# Patient Record
Sex: Female | Born: 1995 | Race: White | Hispanic: No | Marital: Single | Smoking: Never smoker
Health system: Southern US, Community
[De-identification: ages and names within clinical notes are randomized; demographics above are authoritative.]

## PROBLEM LIST (undated history)

## (undated) DIAGNOSIS — J45909 Unspecified asthma, uncomplicated: Secondary | ICD-10-CM

## (undated) DIAGNOSIS — K219 Gastro-esophageal reflux disease without esophagitis: Secondary | ICD-10-CM

## (undated) DIAGNOSIS — I319 Disease of pericardium, unspecified: Secondary | ICD-10-CM

## (undated) HISTORY — PX: TYMPANOSTOMY TUBE PLACEMENT: SHX32

## (undated) HISTORY — PX: TONSILLECTOMY: SUR1361

## (undated) HISTORY — PX: ADENOIDECTOMY: SUR15

## (undated) HISTORY — DX: Gastro-esophageal reflux disease without esophagitis: K21.9

---
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
## 2016-02-16 ENCOUNTER — Encounter (HOSPITAL_COMMUNITY): Payer: Self-pay | Admitting: Emergency Medicine

## 2016-02-16 ENCOUNTER — Emergency Department (HOSPITAL_COMMUNITY): Payer: BLUE CROSS/BLUE SHIELD

## 2016-02-16 ENCOUNTER — Emergency Department (HOSPITAL_COMMUNITY)
Admission: EM | Admit: 2016-02-16 | Discharge: 2016-02-17 | Disposition: A | Discharge status: Home / Self Care | Payer: BLUE CROSS/BLUE SHIELD | Attending: Emergency Medicine | Admitting: Emergency Medicine

## 2016-02-16 DIAGNOSIS — R0789 Other chest pain: Secondary | ICD-10-CM | POA: Diagnosis not present

## 2016-02-16 DIAGNOSIS — Z79899 Other long term (current) drug therapy: Secondary | ICD-10-CM | POA: Diagnosis not present

## 2016-02-16 DIAGNOSIS — Z3202 Encounter for pregnancy test, result negative: Secondary | ICD-10-CM | POA: Insufficient documentation

## 2016-02-16 DIAGNOSIS — J45909 Unspecified asthma, uncomplicated: Secondary | ICD-10-CM | POA: Insufficient documentation

## 2016-02-16 DIAGNOSIS — R079 Chest pain, unspecified: Secondary | ICD-10-CM | POA: Diagnosis present

## 2016-02-16 HISTORY — DX: Disease of pericardium, unspecified: I31.9

## 2016-02-16 HISTORY — DX: Unspecified asthma, uncomplicated: J45.909

## 2016-02-16 LAB — I-STAT BETA HCG BLOOD, ED (MC, WL, AP ONLY): I-stat hCG, quantitative: <5 m[IU]/mL (ref <5)

## 2016-02-16 LAB — I-STAT TROPONIN, ED: TROPONIN I, POC: 0 ng/mL (ref 0.00–0.08)

## 2016-02-16 LAB — BASIC METABOLIC PANEL
Anion gap: 12 (ref 5–15)
BUN: 8 mg/dL (ref 6–20)
CO2: 23 mmol/L (ref 22–32)
CREATININE: 0.73 mg/dL (ref 0.44–1.00)
Calcium: 9.7 mg/dL (ref 8.9–10.3)
Chloride: 105 mmol/L (ref 101–111)
GFR calc Af Amer: >60 mL/min (ref >60)
Glucose, Bld: 92 mg/dL (ref 65–99)
POTASSIUM: 4.1 mmol/L (ref 3.5–5.1)
SODIUM: 140 mmol/L (ref 135–145)

## 2016-02-16 LAB — CBC
HCT: 44 % (ref 36.0–46.0)
Hemoglobin: 14.6 g/dL (ref 12.0–15.0)
MCH: 28.7 pg (ref 26.0–34.0)
MCHC: 33.2 g/dL (ref 30.0–36.0)
MCV: 86.6 fL (ref 78.0–100.0)
Platelets: 354 10*3/uL (ref 150–400)
RBC: 5.08 MIL/uL (ref 3.87–5.11)
RDW: 12.8 % (ref 11.5–15.5)
WBC: 10.9 10*3/uL — ABNORMAL HIGH (ref 4.0–10.5)

## 2016-02-16 NOTE — ED Notes (Signed)
Pt sts midsternal to left sided CP with SOB x 3 hours; pt sts hx of pericarditis in past

## 2016-02-16 NOTE — ED Provider Notes (Signed)
CSN: [id number]     Arrival date & time 02/16/16  1744 History   First MD Initiated Contact with Patient 02/16/16 2125     Chief Complaint  Patient presents with  . Chest Pain     (Consider location/radiation/quality/duration/timing/severity/associated sxs/prior Treatment) HPI Comments: Patient works for EMS. She developed left-sided chest pain around 2:30 this afternoon while she was waiting to pick up the patient. The pain was on the left side of her chest and radiates to her back. It has been constant since. It is worse with palpation. There is no radiation to her neck or arm. It radiates straight through to her back. There is mild associated shortness of breath. No nausea or vomiting. She did have some diaphoresis. No abdominal pain. No cough or fever. She has not had pain like this in the past. She wonders whether this pain was due to a chemical that was being used to clean the patient's room. The pain is not exertional and not associated with lifting a patient. Denies any leg pain or leg swelling. Reports questionable history of pericarditis. She has seen Dr. Reuel Boom in [facility] regarding palpitations and wore a Holter monitor in the past.  The history is provided by the patient.    Past Medical History  Diagnosis Date  . Asthma   . Pericarditis    History reviewed. No pertinent past surgical history. History reviewed. No pertinent family history. Social History  Substance Use Topics  . Smoking status: Never Smoker   . Smokeless tobacco: None  . Alcohol Use: Yes     Comment: occ   OB History    No data available     Review of Systems  Constitutional: Negative for fever, activity change and appetite change.  HENT: Negative for congestion.   Eyes: Negative for visual disturbance.  Respiratory: Positive for chest tightness and shortness of breath. Negative for cough.   Cardiovascular: Positive for chest pain.  Gastrointestinal: Negative for nausea, vomiting and abdominal  pain.  Genitourinary: Negative for dysuria, hematuria and vaginal discharge.  Musculoskeletal: Negative for myalgias and arthralgias.  Skin: Negative for rash.  Neurological: Negative for dizziness, weakness, light-headedness and headaches.  A complete 10 system review of systems was obtained and all systems are negative except as noted in the HPI and PMH.      Allergies  Review of patient's allergies indicates no known allergies.  Home Medications   Prior to Admission medications   Medication Sig Start Date End Date Taking? Authorizing Provider  esomeprazole (NEXIUM) 40 MG capsule Take 1 capsule by mouth daily.   Yes Historical Provider, MD  mometasone-formoterol (DULERA) 100-5 MCG/ACT AERO Inhale 1 puff into the lungs daily as needed. wheezing   Yes Historical Provider, MD  naproxen (NAPROSYN) 500 MG tablet Take 1 tablet (500 mg total) by mouth 2 (two) times daily. 02/17/16   Glynn Octave, MD   BP 117/78 mmHg  Pulse 55  Temp(Src) 97.5 F (36.4 C) (Oral)  Resp 15  SpO2 97% Physical Exam  Constitutional: She is oriented to person, place, and time. She appears well-developed and well-nourished. No distress.  HENT:  Head: Normocephalic and atraumatic.  Mouth/Throat: Oropharynx is clear and moist. No oropharyngeal exudate.  Eyes: Conjunctivae and EOM are normal. Pupils are equal, round, and reactive to light.  Neck: Normal range of motion. Neck supple.  No meningismus.  Cardiovascular: Normal rate, regular rhythm, normal heart sounds and intact distal pulses.   No murmur heard. Pulmonary/Chest: Effort normal and  breath sounds normal. No respiratory distress. She exhibits tenderness.  Reproducible left-sided chest tenderness. Between breasts. Chaperone present.  Abdominal: Soft. There is no tenderness. There is no rebound and no guarding.  Musculoskeletal: Normal range of motion. She exhibits no edema or tenderness.  Neurological: She is alert and oriented to person, place, and  time. No cranial nerve deficit. She exhibits normal muscle tone. Coordination normal.  No ataxia on finger to nose bilaterally. No pronator drift. 5/5 strength throughout. CN 2-12 intact.Equal grip strength. Sensation intact.   Skin: Skin is warm.  Psychiatric: She has a normal mood and affect. Her behavior is normal.  Nursing note and vitals reviewed.   ED Course  Procedures (including critical care time) Labs Review Labs Reviewed  CBC - Abnormal; Notable for the following:    WBC 10.9 (*)    All other components within normal limits  BASIC METABOLIC PANEL  TROPONIN I  D-DIMER, QUANTITATIVE (NOT AT [facility])  I-STAT TROPOININ, ED  I-STAT BETA HCG BLOOD, ED ([facility], [facility], [facility] ONLY)    Imaging Review Dg Chest 2 View  02/16/2016  CLINICAL DATA:  Chest pain and shortness of Breath EXAM: CHEST  2 VIEW COMPARISON:  None. FINDINGS: The heart size and mediastinal contours are within normal limits. Both lungs are clear. The visualized skeletal structures are unremarkable. IMPRESSION: No active cardiopulmonary disease. Electronically Signed   By: Alcide Clever M.D.   On: 02/16/2016 18:21   I have personally reviewed and evaluated these images and lab results as part of my medical decision-making.   EKG Interpretation   Date/Time:  Thursday February 16 2016 21:44:56 EST Ventricular Rate:  75 PR Interval:  135 QRS Duration: 109 QT Interval:  388 QTC Calculation: 433 R Axis:   77 Text Interpretation:  Sinus rhythm RSR' in V1 or V2, right VCD or RVH  Borderline T abnormalities, anterior leads No previous ECGs available  Confirmed by Cortni Tays  MD, Onis Markoff [phone]) on 02/16/2016 10:08:35 PM      MDM   Final diagnoses:  Atypical chest pain   7.5 hours of ongoing chest pain, unchanged. EKG with nonspecific T-wave inversion lead 3 and V3.  Pain reproducible on exam. Chest x-ray negative.  Chest x-ray negative. No pericardial effusion. Unchanged nonspecific T-wave inversion on EKG.  D-dimer  negative. Troponin negative 2. Low suspicion for ACS.  Treat for musculoskeletal chest pain. No evidence of pericarditis. Follow-up with her PCP tomorrow as scheduled. Return precautions discussed.    EMERGENCY DEPARTMENT [place] CARDIAC EXAM "Study: Limited Ultrasound of the heart and pericardium"  INDICATIONS:Dyspnea Multiple views of the heart and pericardium are obtained with a multi-frequency probe.  PERFORMED NW:GNFAOZ  IMAGES ARCHIVED?: Yes  FINDINGS: No pericardial effusion, Normal contractility and Tamponade physiology absent  LIMITATIONS:  Body habitus and Emergent procedure  VIEWS USED: Subcostal 4 chamber and Apical 4 chamber   INTERPRETATION: Cardiac activity present, Pericardial effusioin absent, Cardiac tamponade absent and Normal contractility  COMMENT:       Glynn Octave, MD 02/17/16 3086

## 2016-02-17 LAB — D-DIMER, QUANTITATIVE: D-Dimer, Quant: <0.27 ug/mL-FEU (ref 0.00–0.50)

## 2016-02-17 LAB — TROPONIN I: Troponin I: <0.03 ng/mL (ref <0.031)

## 2016-02-17 MED ORDER — NAPROXEN 500 MG PO TABS: 500.0000 mg | ORAL_TABLET | Freq: Two times a day (BID) | ORAL | Status: DC

## 2016-02-17 NOTE — Discharge Instructions (Signed)
Chest Wall Pain °There is no evidence of heart attack or blood clot in the lung. Follow up with your doctor. Return to the ED if you develop new or worsening symptoms. °Chest wall pain is pain in or around the bones and muscles of your chest. Sometimes, an injury causes this pain. Sometimes, the cause may not be known. This pain may take several weeks or longer to get better. °HOME CARE INSTRUCTIONS  °Pay attention to any changes in your symptoms. Take these actions to help with your pain:  °· Rest as told by your health care provider.   °· Avoid activities that cause pain. These include any activities that use your chest muscles or your abdominal and side muscles to lift heavy items.    °· If directed, apply ice to the painful area: °¨ Put ice in a plastic bag. °¨ Place a towel between your skin and the bag. °¨ Leave the ice on for 20 minutes, 2-3 times per day. °· Take over-the-counter and prescription medicines only as told by your health care provider. °· Do not use tobacco products, including cigarettes, chewing tobacco, and e-cigarettes. If you need help quitting, ask your health care provider. °· Keep all follow-up visits as told by your health care provider. This is important. °SEEK MEDICAL CARE IF: °· You have a fever. °· Your chest pain becomes worse. °· You have new symptoms. °SEEK IMMEDIATE MEDICAL CARE IF: °· You have nausea or vomiting. °· You feel sweaty or light-headed. °· You have a cough with phlegm (sputum) or you cough up blood. °· You develop shortness of breath. °  °This information is not intended to replace advice given to you by your health care provider. Make sure you discuss any questions you have with your health care provider. °  °Document Released: 12/17/2005 Document Revised: 09/07/2015 Document Reviewed: 03/14/2015 °Elsevier Interactive Patient Education ©2016 [employer] ° °

## 2017-07-30 IMAGING — CR DG CHEST 2V
2 series · 2 of 2 positions shown · non-contrast
Comparison: None.

CLINICAL DATA: Chest pain and shortness of Breath

EXAM:
CHEST  2 VIEW

[chest pa]
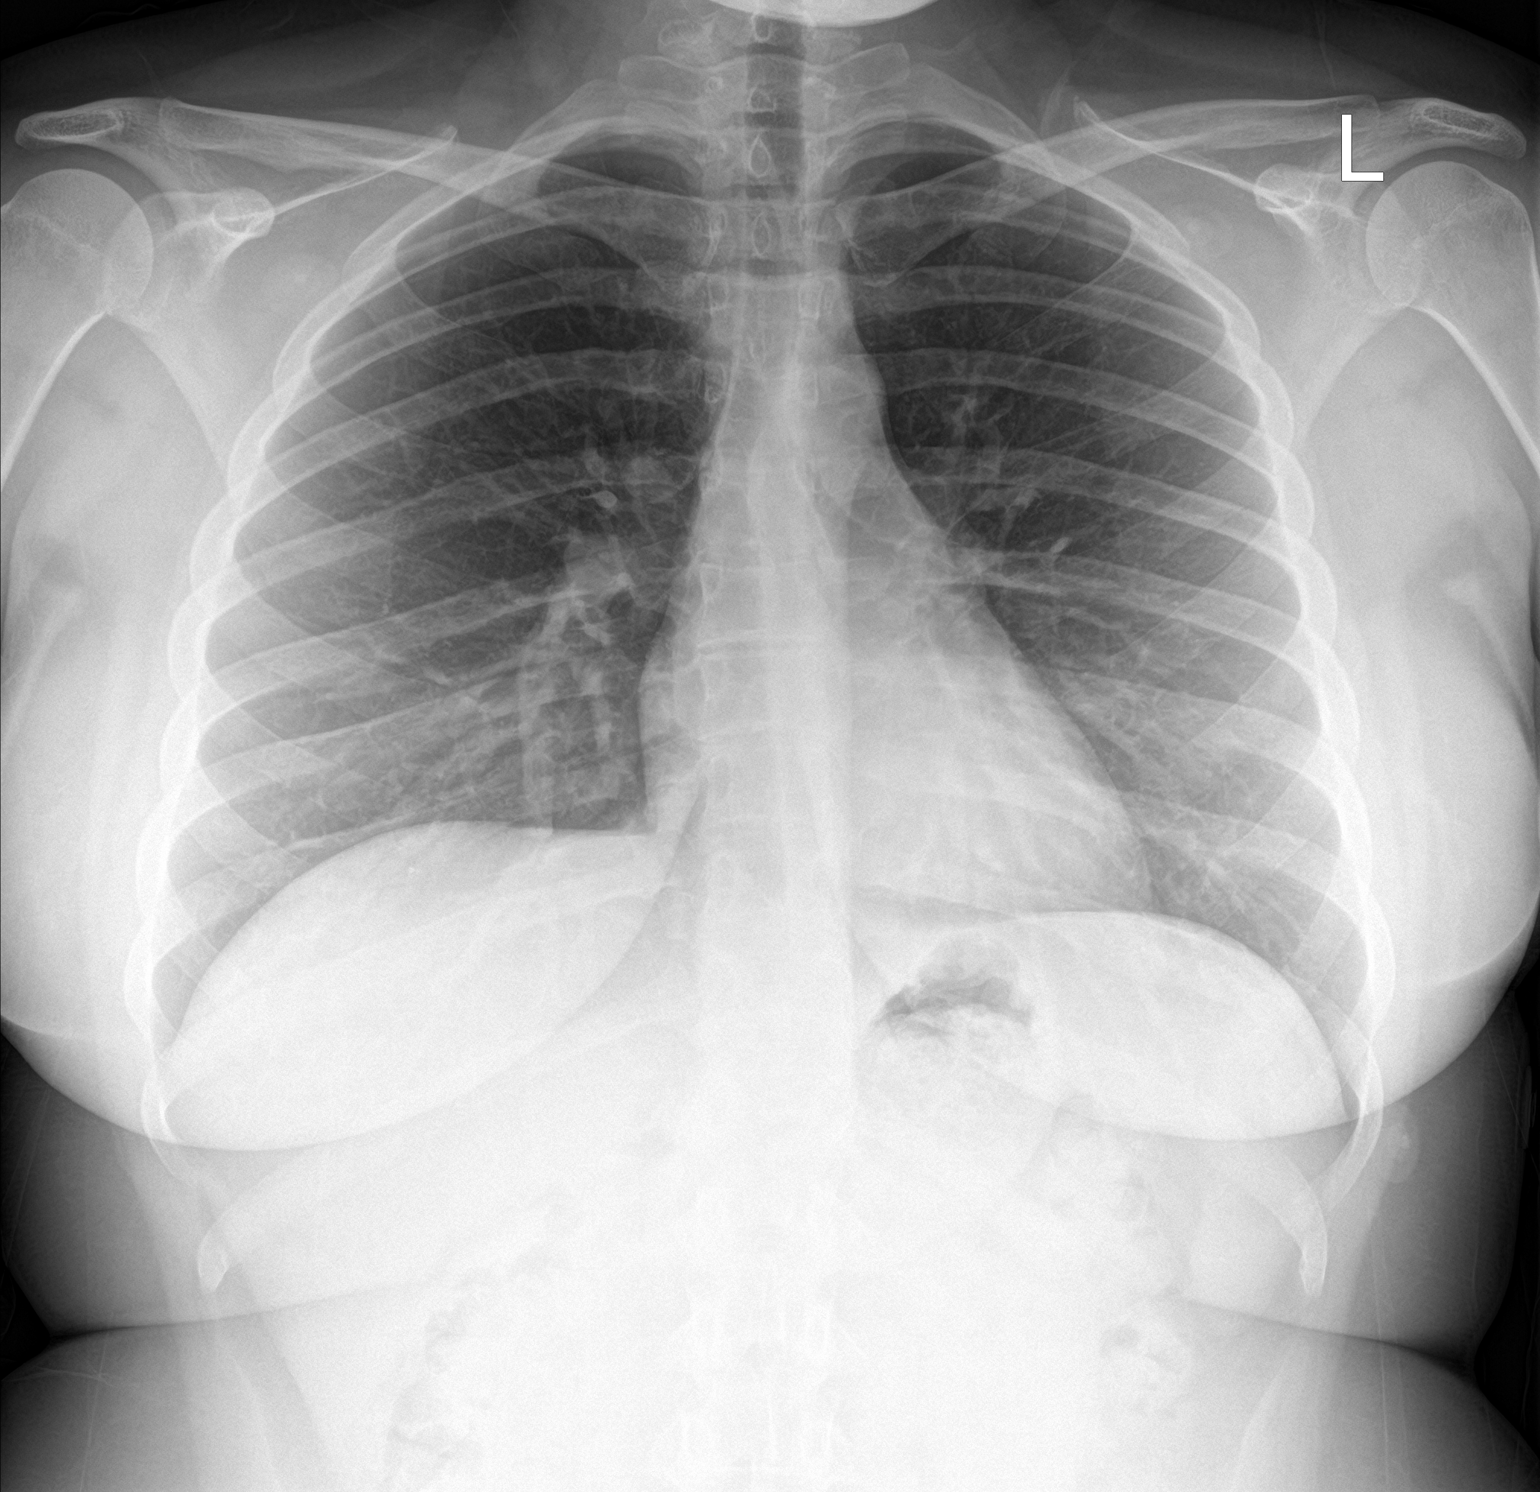

[chest lat]
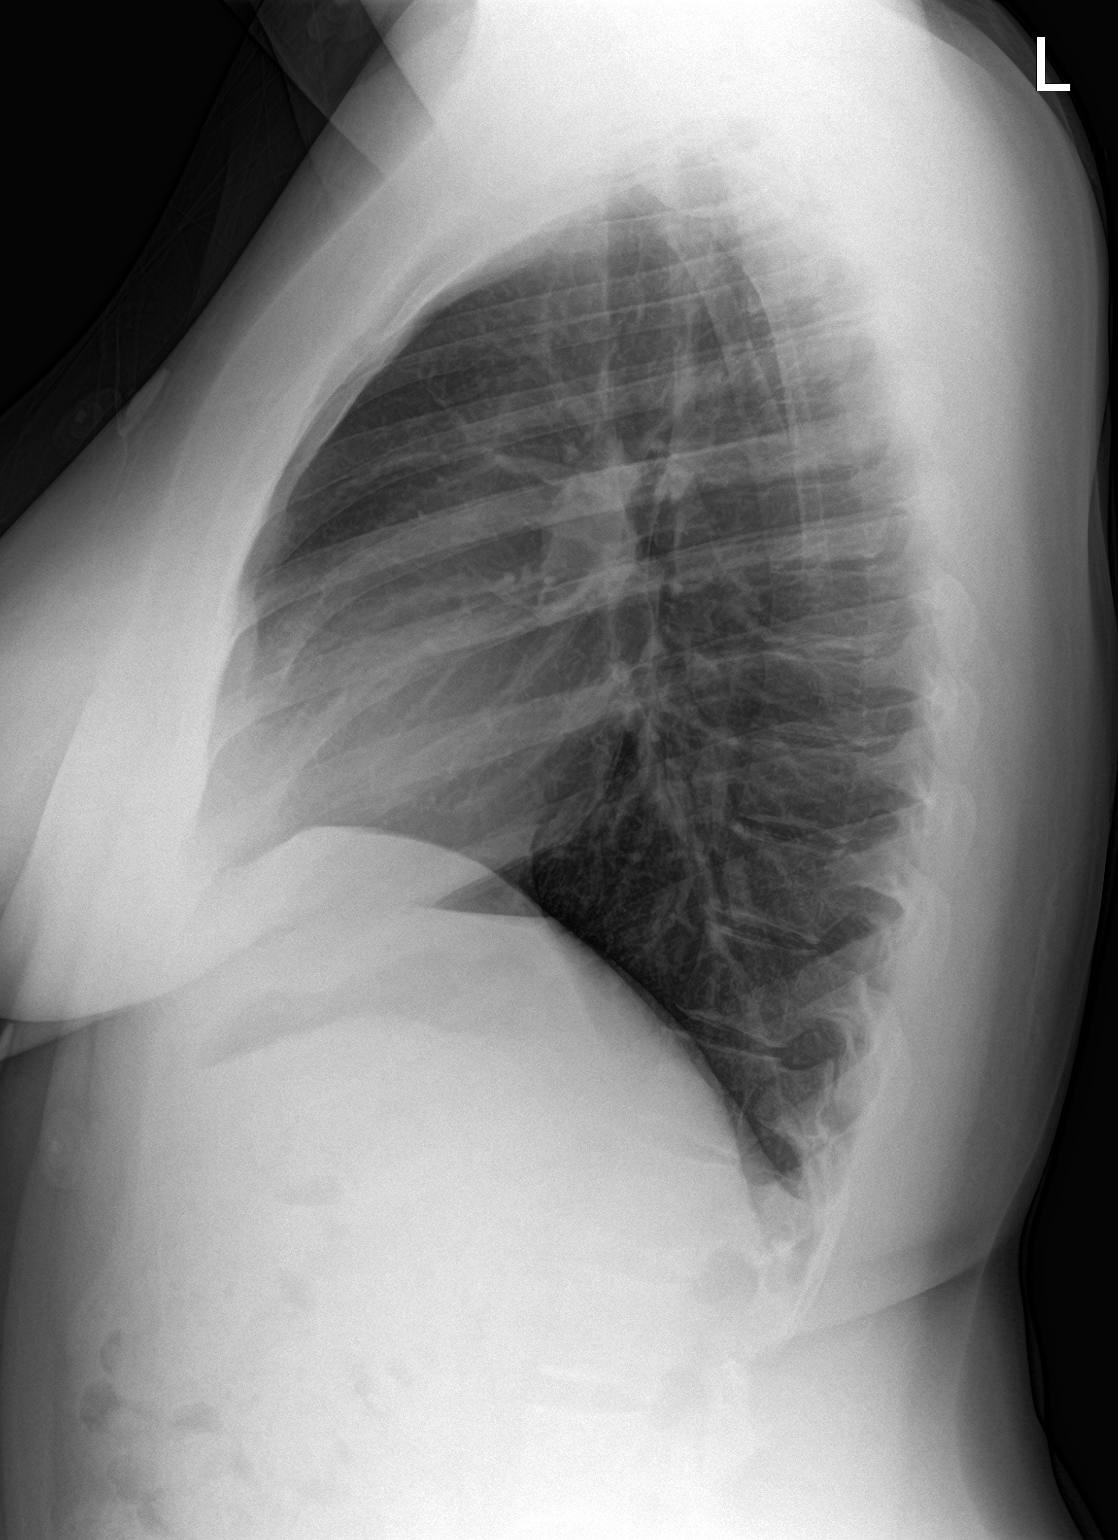

[2 of 2 positions shown; findings below may reference images not displayed]

FINDINGS: The heart size and mediastinal contours are within normal limits.
Both lungs are clear. The visualized skeletal structures are
unremarkable.
IMPRESSION: No active cardiopulmonary disease.

## 2018-01-07 DIAGNOSIS — Z30017 Encounter for initial prescription of implantable subdermal contraceptive: Secondary | ICD-10-CM | POA: Diagnosis not present

## 2018-03-11 ENCOUNTER — Emergency Department (HOSPITAL_COMMUNITY)
Admission: EM | Admit: 2018-03-11 | Discharge: 2018-03-12 | Disposition: A | Discharge status: Home / Self Care | Payer: 59 | Attending: Emergency Medicine | Admitting: Emergency Medicine

## 2018-03-11 ENCOUNTER — Other Ambulatory Visit: Payer: Self-pay

## 2018-03-11 ENCOUNTER — Emergency Department (HOSPITAL_COMMUNITY): Payer: 59

## 2018-03-11 ENCOUNTER — Encounter (HOSPITAL_COMMUNITY): Payer: Self-pay | Admitting: Emergency Medicine

## 2018-03-11 DIAGNOSIS — R05 Cough: Secondary | ICD-10-CM | POA: Diagnosis not present

## 2018-03-11 DIAGNOSIS — R0602 Shortness of breath: Secondary | ICD-10-CM | POA: Diagnosis not present

## 2018-03-11 DIAGNOSIS — R072 Precordial pain: Secondary | ICD-10-CM | POA: Insufficient documentation

## 2018-03-11 DIAGNOSIS — R002 Palpitations: Secondary | ICD-10-CM | POA: Insufficient documentation

## 2018-03-11 DIAGNOSIS — J45909 Unspecified asthma, uncomplicated: Secondary | ICD-10-CM | POA: Diagnosis not present

## 2018-03-11 DIAGNOSIS — Z79899 Other long term (current) drug therapy: Secondary | ICD-10-CM | POA: Insufficient documentation

## 2018-03-11 DIAGNOSIS — R079 Chest pain, unspecified: Secondary | ICD-10-CM | POA: Diagnosis not present

## 2018-03-11 LAB — BASIC METABOLIC PANEL
Anion gap: 11 (ref 5–15)
BUN: 6 mg/dL (ref 6–20)
CALCIUM: 9.1 mg/dL (ref 8.9–10.3)
CO2: 21 mmol/L — AB (ref 22–32)
CREATININE: 0.89 mg/dL (ref 0.44–1.00)
Chloride: 106 mmol/L (ref 101–111)
GFR calc Af Amer: >60 mL/min (ref >60)
GLUCOSE: 91 mg/dL (ref 65–99)
Potassium: 3.2 mmol/L — ABNORMAL LOW (ref 3.5–5.1)
Sodium: 138 mmol/L (ref 135–145)

## 2018-03-11 LAB — CBC
HCT: 44.4 % (ref 36.0–46.0)
Hemoglobin: 14.8 g/dL (ref 12.0–15.0)
MCH: 28.6 pg (ref 26.0–34.0)
MCHC: 33.3 g/dL (ref 30.0–36.0)
MCV: 85.9 fL (ref 78.0–100.0)
PLATELETS: 364 10*3/uL (ref 150–400)
RBC: 5.17 MIL/uL — ABNORMAL HIGH (ref 3.87–5.11)
RDW: 13 % (ref 11.5–15.5)
WBC: 13.8 10*3/uL — ABNORMAL HIGH (ref 4.0–10.5)

## 2018-03-11 LAB — I-STAT TROPONIN, ED: TROPONIN I, POC: 0 ng/mL (ref 0.00–0.08)

## 2018-03-11 LAB — I-STAT BETA HCG BLOOD, ED (MC, WL, AP ONLY): I-stat hCG, quantitative: <5 m[IU]/mL (ref <5)

## 2018-03-11 NOTE — ED Triage Notes (Signed)
Pt reports left sided chest pain that moves into her back since yesterday.  Also complains of SOB upon exersion, one episode of dizziness and emesis ("that happens a lot anyway.":)  Pt reports at one point she had a resting heart rate of 140.

## 2018-03-12 LAB — D-DIMER, QUANTITATIVE: D-Dimer, Quant: 0.31 ug/mL-FEU (ref 0.00–0.50)

## 2018-03-12 NOTE — ED Notes (Signed)
ED Provider at bedside. 

## 2018-03-12 NOTE — ED Provider Notes (Signed)
[facility] EMERGENCY DEPARTMENT Provider Note   CSN: [id number] Arrival date & time: 03/11/18  2225     History   Chief Complaint Chief Complaint  Patient presents with  . Chest Pain  . Shortness of Breath    HPI Kelly Burton is a 22 y.o. female.  The history is provided by the patient and a relative.  Chest Pain   This is a new problem. The current episode started 2 days ago. The problem occurs daily. The problem has been gradually worsening. The pain is moderate. The quality of the pain is described as sharp. The pain radiates to the upper back. Associated symptoms include cough, dizziness, palpitations and shortness of breath. Pertinent negatives include no abdominal pain, no fever, no hemoptysis, no lower extremity edema, no syncope and no vomiting.  Pertinent negatives for past medical history include no CAD, no DVT and no PE.  Shortness of Breath  Associated symptoms include cough and chest pain. Pertinent negatives include no fever, no hemoptysis, no syncope, no vomiting, no abdominal pain and no leg swelling. Associated medical issues do not include PE, CAD or DVT.   History of asthma presents with chest pain.  She reports for the past 2 days been having persistent chest pain with episodes of sharp chest pain.  She reports shortness of breath.  Denies actual pleurisy.  No hemoptysis.  No fevers.  No syncope. She was seen by her primary doctor 2 days ago.  Her primary doctor noted that she had a friction rub, start her on antibiotics and anti-inflammatories.  She is scheduled to have an outpatient echocardiogram No previous history of pericarditis. Earlier in the day she noted that her chest pain is worsening.  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  Denies known history of VTE.  She does have Implanon implant Past Medical History:  Diagnosis Date  . Asthma   .  Pericarditis     There are no active problems to display for this patient.   History reviewed. No pertinent surgical history.  OB History    No data available       Home Medications    Prior to Admission medications   Medication Sig Start Date End Date Taking? Authorizing Provider  esomeprazole (NEXIUM) 40 MG capsule Take 40 mg by mouth daily.    Yes [provider]  lisdexamfetamine (VYVANSE) 50 MG capsule Take 50 mg by mouth daily.   Yes [provider]  metFORMIN (GLUCOPHAGE-XR) 500 MG 24 hr tablet Take 1,000 mg by mouth at bedtime. 02/24/18  Yes [provider]  mometasone-formoterol (DULERA) 100-5 MCG/ACT AERO Inhale 2 puffs into the lungs daily as needed for wheezing. wheezing   Yes [provider]  naproxen (NAPROSYN) 500 MG tablet Take 1 tablet (500 mg total) by mouth 2 (two) times daily. Patient not taking: Reported on 03/12/2018 02/17/16   Glynn Octave, MD    Family History No family history on file.  Social History Social History   Tobacco Use  . Smoking status: Never Smoker  Substance Use Topics  . Alcohol use: Yes    Comment: occ  . Drug use: No     Allergies   Patient has no known allergies.   Review of Systems Review of Systems  Constitutional: Negative for fever.  Respiratory: Positive for cough and shortness of breath. Negative for hemoptysis.  Cardiovascular: Positive for chest pain and palpitations. Negative for leg swelling and syncope.  Gastrointestinal: Negative for abdominal pain and vomiting.  Neurological: Positive for dizziness. Negative for syncope.  All other systems reviewed and are negative.    Physical Exam Updated Vital Signs BP 126/82   Pulse 87   Temp 98.3 F (36.8 C) (Oral)   Resp (!) 31   Ht 1.626 m (5\' 4" )   Wt 97.5 kg (215 lb)   SpO2 100%   BMI 36.90 kg/m   Physical Exam  CONSTITUTIONAL: Well developed/well nourished, anxious HEAD: Normocephalic/atraumatic EYES:  EOMI/PERRL ENMT: Mucous membranes moist NECK: supple no meningeal signs SPINE/BACK:entire spine nontender CV: S1/S2 noted, no murmurs/rubs/gallops noted, tachycardic LUNGS: Lungs are clear to auscultation bilaterally, no apparent distress ABDOMEN: soft, nontender, no rebound or guarding, bowel sounds noted throughout abdomen GU:no cva tenderness NEURO: Pt is awake/alert/appropriate, moves all extremitiesx4.  No facial droop.   EXTREMITIES: pulses normal/equal, full ROM, no calf tenderness or edema SKIN: warm, color normal PSYCH: Anxious  ED Treatments / Results  Labs (all labs ordered are listed, but only abnormal results are displayed) Labs Reviewed  BASIC METABOLIC PANEL - Abnormal; Notable for the following components:      Result Value   Potassium 3.2 (*)    CO2 21 (*)    All other components within normal limits  CBC - Abnormal; Notable for the following components:   WBC 13.8 (*)    RBC 5.17 (*)    All other components within normal limits  D-DIMER, QUANTITATIVE (NOT AT [facility]  I-STAT TROPONIN, ED  I-STAT BETA HCG BLOOD, ED ([facility], [facility], [facility] ONLY)    EKG  EKG Interpretation  Date/Time:  Tuesday March 11 2018 22:32:26 EDT Ventricular Rate:  108 PR Interval:  130 QRS Duration: 88 QT Interval:  330 QTC Calculation: 442 R Axis:   95 Text Interpretation:  Sinus tachycardia Rightward axis Nonspecific T wave abnormality Abnormal ECG When compared with ECG of 02/16/2016, No significant change was found Confirmed by Dione Booze ([id number]) on 03/11/2018 11:36:30 PM       Radiology Dg Chest 2 View  Result Date: 03/11/2018 CLINICAL DATA:  Left-sided chest pain, dyspnea and asthma with cough. EXAM: CHEST - 2 VIEW COMPARISON:  02/16/2016 FINDINGS: The heart size and mediastinal contours are within normal limits. Both lungs are clear. The visualized skeletal structures are unremarkable. IMPRESSION: No active cardiopulmonary disease. Electronically Signed   By: Tollie Eth M.D.   On:  03/11/2018 23:21    Procedures Procedures    EMERGENCY DEPARTMENT [place] CARDIAC EXAM "Study: Limited Ultrasound of the Heart and Pericardium"  INDICATIONS:Chest pain Multiple views of the heart and pericardium were obtained in real-time with a multi-frequency probe.  PERFORMED UE:AVWUJW IMAGES ARCHIVED?: Yes LIMITATIONS:  Emergent procedure VIEWS USED: Subcostal 4 chamber INTERPRETATION: Pericardial effusioin absent   Medications Ordered in ED Medications - No data to display   Initial Impression / Assessment and Plan / ED Course  I have reviewed the triage vital signs and the nursing notes.  Pertinent labs & imaging results that were available during my care of the patient were reviewed by me and considered in my medical decision making (see chart for details).     Pt presents with chest pain for the past 2 days.  She reports her PCP told her she had a friction rub and outpatient workup has been initiated.  Due to worsening chest pain today she decided to be evaluated.  She reports that  she was informed by her medical doctor her EKG was abnormal I reviewed her EKG from earlier in the day, EKGs tonight and on previous ED visits there is no significant change. Chest x-ray is negative, no cardiomegaly.  No signs of myocarditis.  No obvious signs of pericarditis or alternans Bedside ultrasound does not reveal obvious effusion, but limited due to emergent procedure Suspicion for ACS/PE is low.  I doubt dissection. She can continue outpatient workup  Final Clinical Impressions(s) / ED Diagnoses   Final diagnoses:  Precordial pain  Palpitations    ED Discharge Orders    None       Zadie Rhine, MD 03/12/18 [phone]

## 2018-03-12 NOTE — ED Notes (Signed)
Ambulation in hallway with steady gait

## 2018-03-12 NOTE — Discharge Instructions (Signed)

## 2018-03-21 DIAGNOSIS — R7989 Other specified abnormal findings of blood chemistry: Secondary | ICD-10-CM | POA: Diagnosis not present

## 2018-03-21 DIAGNOSIS — R072 Precordial pain: Secondary | ICD-10-CM | POA: Diagnosis not present

## 2018-03-25 DIAGNOSIS — R9431 Abnormal electrocardiogram [ECG] [EKG]: Secondary | ICD-10-CM | POA: Diagnosis not present

## 2018-04-08 DIAGNOSIS — J4541 Moderate persistent asthma with (acute) exacerbation: Secondary | ICD-10-CM | POA: Diagnosis not present

## 2018-04-16 DIAGNOSIS — R002 Palpitations: Secondary | ICD-10-CM | POA: Diagnosis not present

## 2018-04-16 DIAGNOSIS — R072 Precordial pain: Secondary | ICD-10-CM | POA: Diagnosis not present

## 2018-04-16 DIAGNOSIS — E876 Hypokalemia: Secondary | ICD-10-CM | POA: Diagnosis not present

## 2018-04-22 DIAGNOSIS — R002 Palpitations: Secondary | ICD-10-CM | POA: Diagnosis not present

## 2018-05-08 DIAGNOSIS — R072 Precordial pain: Secondary | ICD-10-CM | POA: Diagnosis not present

## 2018-05-13 DIAGNOSIS — R002 Palpitations: Secondary | ICD-10-CM | POA: Diagnosis not present

## 2018-09-20 DIAGNOSIS — J209 Acute bronchitis, unspecified: Secondary | ICD-10-CM | POA: Diagnosis not present

## 2018-09-20 DIAGNOSIS — J45909 Unspecified asthma, uncomplicated: Secondary | ICD-10-CM | POA: Diagnosis not present

## 2018-11-17 DIAGNOSIS — G8929 Other chronic pain: Secondary | ICD-10-CM | POA: Diagnosis not present

## 2018-11-17 DIAGNOSIS — M545 Low back pain: Secondary | ICD-10-CM | POA: Diagnosis not present

## 2018-12-17 DIAGNOSIS — M545 Low back pain: Secondary | ICD-10-CM | POA: Diagnosis not present

## 2019-01-01 DIAGNOSIS — M545 Low back pain: Secondary | ICD-10-CM | POA: Diagnosis not present

## 2019-01-12 DIAGNOSIS — M545 Low back pain: Secondary | ICD-10-CM | POA: Diagnosis not present

## 2019-01-16 DIAGNOSIS — M545 Low back pain: Secondary | ICD-10-CM | POA: Diagnosis not present

## 2019-01-19 DIAGNOSIS — J4521 Mild intermittent asthma with (acute) exacerbation: Secondary | ICD-10-CM | POA: Diagnosis not present

## 2019-01-22 DIAGNOSIS — F17210 Nicotine dependence, cigarettes, uncomplicated: Secondary | ICD-10-CM | POA: Diagnosis not present

## 2019-01-22 DIAGNOSIS — R05 Cough: Secondary | ICD-10-CM | POA: Diagnosis not present

## 2019-01-22 DIAGNOSIS — Z79899 Other long term (current) drug therapy: Secondary | ICD-10-CM | POA: Diagnosis not present

## 2019-01-22 DIAGNOSIS — J189 Pneumonia, unspecified organism: Secondary | ICD-10-CM | POA: Diagnosis not present

## 2019-01-30 DIAGNOSIS — M545 Low back pain: Secondary | ICD-10-CM | POA: Diagnosis not present

## 2019-02-12 DIAGNOSIS — G4700 Insomnia, unspecified: Secondary | ICD-10-CM | POA: Diagnosis not present

## 2019-03-19 ENCOUNTER — Other Ambulatory Visit: Payer: Self-pay

## 2019-03-19 ENCOUNTER — Encounter: Payer: Self-pay | Admitting: Allergy and Immunology

## 2019-03-19 ENCOUNTER — Ambulatory Visit (INDEPENDENT_AMBULATORY_CARE_PROVIDER_SITE_OTHER): Payer: 59 | Admitting: Allergy and Immunology

## 2019-03-19 VITALS — BP 126/88 | HR 108 | Temp 98.6°F | Resp 18 | Ht 64.0 in | Wt 217.8 lb

## 2019-03-19 DIAGNOSIS — J383 Other diseases of vocal cords: Secondary | ICD-10-CM | POA: Diagnosis not present

## 2019-03-19 DIAGNOSIS — F458 Other somatoform disorders: Secondary | ICD-10-CM | POA: Diagnosis not present

## 2019-03-19 DIAGNOSIS — J4540 Moderate persistent asthma, uncomplicated: Secondary | ICD-10-CM | POA: Diagnosis not present

## 2019-03-19 DIAGNOSIS — R740 Nonspecific elevation of levels of transaminase and lactic acid dehydrogenase [LDH]: Secondary | ICD-10-CM

## 2019-03-19 DIAGNOSIS — K219 Gastro-esophageal reflux disease without esophagitis: Secondary | ICD-10-CM

## 2019-03-19 DIAGNOSIS — R5381 Other malaise: Secondary | ICD-10-CM | POA: Diagnosis not present

## 2019-03-19 DIAGNOSIS — E876 Hypokalemia: Secondary | ICD-10-CM

## 2019-03-19 DIAGNOSIS — R7401 Elevation of levels of liver transaminase levels: Secondary | ICD-10-CM

## 2019-03-19 DIAGNOSIS — R000 Tachycardia, unspecified: Secondary | ICD-10-CM

## 2019-03-19 MED ORDER — MOMETASONE FURO-FORMOTEROL FUM 200-5 MCG/ACT IN AERO: INHALATION_SPRAY | RESPIRATORY_TRACT | 5 refills | Status: DC

## 2019-03-19 MED ORDER — ESOMEPRAZOLE MAGNESIUM 40 MG PO CPDR: DELAYED_RELEASE_CAPSULE | ORAL | 5 refills | Status: DC

## 2019-03-19 NOTE — Patient Instructions (Addendum)
  1.  Continue Dulera 200 - 2 inhalations 1-2 times per day depending on disease activity  2.  Increase Nexium to 40 mg twice a day  3.  Engage in progressive exercise program  4.  Use hyperventilation rebreathing technique if needed  5.  Blood - CBC w/diff, CMP, TSH, T4  6.  Return to clinic in 3 weeks or earlier if problem

## 2019-03-19 NOTE — Progress Notes (Signed)
[place] - [place] - [place] - [place] - [place]   Dear Dr. Elton Sin,  [place]offat for referring Kelly Burton to the [facility] [place] on 03/19/2019.   Below is a summation of this patient's evaluation and recommendations.  Thank you for your referral. I will keep you informed about this patient's response to treatment.   If you have any questions please do not hesitate to contact me.   Sincerely,   Jessica Priest, MD Allergy / Immunology [facility]   ______________________________________________________________________    NEW PATIENT NOTE  Referring Provider: Marylynn Pearson, MD Primary Provider: Marylynn Pearson, MD Date of office visit: 03/19/2019    Subjective:   Chief Complaint:  Kelly Burton (DOB: 1996-07-27) is a 23 y.o. female who presents to the clinic on 03/19/2019 with a chief complaint of Asthma and Cough .     HPI: Kelly Burton presents to this clinic in evaluation of dyspnea.  Kelly Burton has a long history of asthma for which she has used [facility] for a prolonged period in time with very good results.  Her pattern of respiratory tract symptoms appeared to change in October 2019.  At that point in time she developed unrelenting coughing spells associated with posttussive emesis and laryngeal spasm.  In late January 2020 she then developed rather significant dyspnea on exertion for which she went to the urgent care center and was treated with prednisone and Medrol and about 3 days later she then spiked a high fever and went to the emergency room and was diagnosed with a pneumonia and given doxycycline.  Since that point in time her spells of cough are better and she does not have a fever and she does not have any sputum production and in fact she has no significant respiratory tract symptoms except for very occasional cough.  However, she is completely winded when she exerts  herself.  Sometimes she gets hand cramping and lip tingling and leg numbness when she exerts herself.     Interestingly, apparently in January 2019, she had a another episode where she had very significant dyspnea on exertion and she had a normal d-dimer and chest x-ray and chest CT scan which apparently identified no significant abnormality.  With that episode she did have chest pain.  About several weeks after her evaluation for what sounds like pulmonary embolus she developed a very significant episode of respiratory tract difficulty for which she was treated once again with lots of systemic steroids with resolution of that issue within several weeks.  She does have a history of reflux but this appears to be under very good control at this point in time on Nexium 40 mg daily  Past Medical History:  Diagnosis Date  . Asthma   . Pericarditis     Past Surgical History:  Procedure Laterality Date  . ADENOIDECTOMY    . TONSILLECTOMY    . TYMPANOSTOMY TUBE PLACEMENT      Allergies as of 03/19/2019   No Known Allergies     Medication List      albuterol 108 (90 Base) MCG/ACT inhaler Commonly known as:  PROVENTIL HFA;VENTOLIN HFA Inhale into the lungs.   esomeprazole 40 MG capsule Commonly known as:  NEXIUM Take 40 mg by mouth daily.   lisdexamfetamine 50 MG capsule Commonly known as:  VYVANSE Take 50 mg by mouth daily.   metFORMIN 500 MG 24 hr tablet Commonly known as:  GLUCOPHAGE-XR  Take 1,000 mg by mouth at bedtime.   mometasone-formoterol 100-5 MCG/ACT Aero Commonly known as:  DULERA Inhale 2 puffs into the lungs daily as needed for wheezing. wheezing   zolpidem 6.25 MG CR tablet Commonly known as:  AMBIEN CR       Review of systems negative except as noted in HPI / PMHx or noted below:  Review of Systems  Constitutional: Negative.   HENT: Negative.   Eyes: Negative.   Respiratory: Negative.   Cardiovascular: Negative.   Gastrointestinal: Negative.    Genitourinary: Negative.   Musculoskeletal: Negative.   Skin: Negative.   Neurological: Negative.   Endo/Heme/Allergies: Negative.   Psychiatric/Behavioral: Negative.     History reviewed. No pertinent family history.  Social History   Socioeconomic History  . Marital status: Single    Spouse name: Not on file  . Number of children: Not on file  . Years of education: Not on file  . Highest education level: Not on file  Occupational History  . Not on file  Social Needs  . Financial resource strain: Not on file  . Food insecurity:    Worry: Not on file    Inability: Not on file  . Transportation needs:    Medical: Not on file    Non-medical: Not on file  Tobacco Use  . Smoking status: Never Smoker  Substance and Sexual Activity  . Alcohol use: Yes    Comment: occ  . Drug use: No  . Sexual activity: Not on file  Lifestyle  . Physical activity:    Days per week: Not on file    Minutes per session: Not on file  . Stress: Not on file  Relationships  . Social connections:    Talks on phone: Not on file    Gets together: Not on file    Attends religious service: Not on file    Active member of club or organization: Not on file    Attends meetings of clubs or organizations: Not on file    Relationship status: Not on file  . Intimate partner violence:    Fear of current or ex partner: Not on file    Emotionally abused: Not on file    Physically abused: Not on file    Forced sexual activity: Not on file  Other Topics Concern  . Not on file  Social History Narrative  . Not on file    Environmental and Social history  Lives in a house with a dry environment, no animals located inside the household, no carpet in the bedroom, no plastic on the bed, no plastic on the pillows, no smokers located inside the household.  She is a paramedic.  Objective:   Vitals:   03/19/19 1410  BP: 126/88  Pulse: (!) 108  Resp: 18  Temp: 98.6 F (37 C)  SpO2: 97%   Height: 5'  4" (162.6 cm) Weight: 217 lb 12.8 oz (98.8 kg)  Physical Exam Constitutional:      Appearance: She is not diaphoretic.     Comments: Intermittent barky cough approximately every 2-3 minutes  HENT:     Head: Normocephalic. No right periorbital erythema or left periorbital erythema.     Right Ear: Tympanic membrane, ear canal and external ear normal.     Left Ear: Tympanic membrane, ear canal and external ear normal.     Nose: Nose normal. No mucosal edema or rhinorrhea.     Mouth/Throat:     Pharynx: No oropharyngeal exudate.  Eyes:     General: Lids are normal.     Conjunctiva/sclera: Conjunctivae normal.     Pupils: Pupils are equal, round, and reactive to light.  Neck:     Thyroid: No thyromegaly.     Trachea: Trachea normal. No tracheal deviation.  Cardiovascular:     Rate and Rhythm: Normal rate and regular rhythm.     Heart sounds: Normal heart sounds, S1 normal and S2 normal. No murmur.  Pulmonary:     Effort: Pulmonary effort is normal. No respiratory distress.     Breath sounds: No stridor. No wheezing or rales.  Chest:     Chest wall: No tenderness.  Abdominal:     General: There is no distension.     Palpations: Abdomen is soft. There is no mass.     Tenderness: There is no abdominal tenderness. There is no guarding or rebound.  Musculoskeletal:        General: No tenderness.  Lymphadenopathy:     Head:     Right side of head: No tonsillar adenopathy.     Left side of head: No tonsillar adenopathy.     Cervical: No cervical adenopathy.  Skin:    Coloration: Skin is not pale.     Findings: No erythema or rash.     Nails: There is no clubbing.   Neurological:     Mental Status: She is alert.     Diagnostics: Allergy skin tests were not performed.   Spirometry was performed and demonstrated an FEV1 of 2.73 @ 83 % of predicted. FEV1/FVC = 0.95.  Following the administration of nebulized albuterol her FEV1 rose to 3.44 which was an increase of 26%.  Oxygen  saturation on room air was 98%.  Oxygen saturation on room air during walking up and down the hallway was 97%.  Results of a chest x-ray obtained 22 January 2019 identified the following:  Mediastinal hilar structures normal.  Heart size normal.  Mild left perihilar infiltrate cannot be excluded.  No pleural effusion or pneumothorax.  No acute bony abnormality.  Results of a chest x-ray obtained 11 March 2018 identified the following:  The heart size and mediastinal contours are within normal limits. Both lungs are clear. The visualized skeletal structures are Unremarkable.  Results of a chest CT angiography obtained 21 March 2018 identified no evidence of pulmonary embolus and no significant abnormality in mediastinum or lungs.  Results of an echocardiogram obtained 08 May 2018 identified the following:  Normal left ventricular systolic function Ejection fraction is visually estimated at 60-65% Normal diastolic function Normal echocardiogram  Results of blood tests obtained 10 October 2017 identified creatinine 0.85 mg/DL, AST 15N/B, ALT 39Y/D  Results of blood tests obtained 11 March 2018 identified creatinine 0.89 mg/DL, potassium 3.2 mmol/L, CO2 21 mmol/L, WBC 13.8, hemoglobin 14.8, platelet 364, d-dimer 0.31 UG/mL  Assessment and Plan:    1. Not well controlled moderate persistent asthma   2. Hyperventilation syndrome   3. Vocal cord dysfunction   4. Physical deconditioning   5. Gastroesophageal reflux disease, esophagitis presence not specified   6. Elevated transaminase level   7. Hypokalemia   8. Tachycardia     1.  Continue Dulera 200 - 2 inhalations 1-2 times per day depending on disease activity  2.  Increase Nexium to 40 mg twice a day  3.  Engage in progressive exercise program  4.  Use hyperventilation rebreathing technique if needed  5.  Blood - CBC w/diff, CMP, TSH,  T4  6.  Return to clinic in 3 weeks or earlier if problem  Heilyn has dyspnea on  exertion and there may be a component of hyperventilation or cardiovascular deconditioning and based upon her coughing today I suspect that she also has a component of LPR that is active and I have had a talk with her today about these issues on how to address each 1 of these issues over the course of the next 3 weeks.  As well, she appears to have some other issues that have been identified by blood tests obtained in the past and we will have her repeat blood tests examining for her elevated liver function, hypokalemia, and resting tachycardia.  I will see her back in this clinic in 3 weeks to consider further evaluation and treatment.  Jessica Priest, MD Allergy / Immunology [facility] [place]

## 2019-03-20 LAB — CBC WITH DIFFERENTIAL/PLATELET
BASOS: 0 %
Basophils Absolute: 0.1 10*3/uL (ref 0.0–0.2)
EOS (ABSOLUTE): 0.1 10*3/uL (ref 0.0–0.4)
EOS: 1 %
HEMATOCRIT: 43.1 % (ref 34.0–46.6)
HEMOGLOBIN: 15.2 g/dL (ref 11.1–15.9)
IMMATURE GRANULOCYTES: 1 %
Immature Grans (Abs): 0.1 10*3/uL (ref 0.0–0.1)
LYMPHS ABS: 3 10*3/uL (ref 0.7–3.1)
Lymphs: 21 %
MCH: 30 pg (ref 26.6–33.0)
MCHC: 35.3 g/dL (ref 31.5–35.7)
MCV: 85 fL (ref 79–97)
MONOCYTES: 11 %
Monocytes Absolute: 1.5 10*3/uL — ABNORMAL HIGH (ref 0.1–0.9)
Neutrophils Absolute: 9.4 10*3/uL — ABNORMAL HIGH (ref 1.4–7.0)
Neutrophils: 66 %
Platelets: 376 10*3/uL (ref 150–450)
RBC: 5.07 x10E6/uL (ref 3.77–5.28)
RDW: 12.3 % (ref 11.7–15.4)
WBC: 14.2 10*3/uL — AB (ref 3.4–10.8)

## 2019-03-20 LAB — COMPREHENSIVE METABOLIC PANEL
A/G RATIO: 2 (ref 1.2–2.2)
ALBUMIN: 4.6 g/dL (ref 3.9–5.0)
ALT: 41 IU/L — ABNORMAL HIGH (ref 0–32)
AST: 23 IU/L (ref 0–40)
Alkaline Phosphatase: 81 IU/L (ref 39–117)
BUN / CREAT RATIO: 13 (ref 9–23)
BUN: 11 mg/dL (ref 6–20)
Bilirubin Total: 0.4 mg/dL (ref 0.0–1.2)
CALCIUM: 9.6 mg/dL (ref 8.7–10.2)
CO2: 22 mmol/L (ref 20–29)
Chloride: 103 mmol/L (ref 96–106)
Creatinine, Ser: 0.85 mg/dL (ref 0.57–1.00)
GFR calc Af Amer: 111 mL/min/{1.73_m2} (ref >59)
GFR calc non Af Amer: 96 mL/min/{1.73_m2} (ref >59)
GLOBULIN, TOTAL: 2.3 g/dL (ref 1.5–4.5)
Glucose: 83 mg/dL (ref 65–99)
POTASSIUM: 3.9 mmol/L (ref 3.5–5.2)
SODIUM: 142 mmol/L (ref 134–144)
Total Protein: 6.9 g/dL (ref 6.0–8.5)

## 2019-03-20 LAB — TSH+FREE T4
Free T4: 1.14 ng/dL (ref 0.82–1.77)
TSH: 1.5 u[IU]/mL (ref 0.450–4.500)

## 2019-03-23 ENCOUNTER — Encounter: Payer: Self-pay | Admitting: Allergy and Immunology

## 2019-04-13 ENCOUNTER — Other Ambulatory Visit: Payer: Self-pay

## 2019-04-13 ENCOUNTER — Encounter: Payer: Self-pay | Admitting: Allergy and Immunology

## 2019-04-13 ENCOUNTER — Ambulatory Visit (INDEPENDENT_AMBULATORY_CARE_PROVIDER_SITE_OTHER): Payer: 59 | Admitting: Allergy and Immunology

## 2019-04-13 VITALS — BP 118/82 | HR 88 | Temp 98.4°F | Resp 16

## 2019-04-13 DIAGNOSIS — R5381 Other malaise: Secondary | ICD-10-CM | POA: Diagnosis not present

## 2019-04-13 DIAGNOSIS — F458 Other somatoform disorders: Secondary | ICD-10-CM | POA: Diagnosis not present

## 2019-04-13 DIAGNOSIS — R740 Nonspecific elevation of levels of transaminase and lactic acid dehydrogenase [LDH]: Secondary | ICD-10-CM | POA: Diagnosis not present

## 2019-04-13 DIAGNOSIS — J383 Other diseases of vocal cords: Secondary | ICD-10-CM | POA: Diagnosis not present

## 2019-04-13 DIAGNOSIS — J4540 Moderate persistent asthma, uncomplicated: Secondary | ICD-10-CM | POA: Diagnosis not present

## 2019-04-13 DIAGNOSIS — K219 Gastro-esophageal reflux disease without esophagitis: Secondary | ICD-10-CM

## 2019-04-13 DIAGNOSIS — R7401 Elevation of levels of liver transaminase levels: Secondary | ICD-10-CM

## 2019-04-13 DIAGNOSIS — Z6837 Body mass index (BMI) 37.0-37.9, adult: Secondary | ICD-10-CM

## 2019-04-13 NOTE — Patient Instructions (Addendum)
  1.  Continue Dulera 200 - 2 inhalations 1-2 times per day depending on disease activity  2.  Continue Nexium to 40 mg twice a day  3.  Engage in progressive exercise program  4.  Use hyperventilation rebreathing technique if needed  5.  Blood - LFT panel, Hepatitis C Ab, ANA w/reflex, Anti-smooth muscle / actin Ab, anti-LKM Ab, Anti-mitochondrial Ab  6. 24 hour urine for cortisol and creatinine.  7.  Return to clinic in 12 weeks or earlier if problem

## 2019-04-13 NOTE — Progress Notes (Signed)
- [place] - [place] - [place] - [place]   Follow-up Note  Referring Provider: Marylynn Pearson, MD Primary Provider: Laurel Dimmer, FNP Date of Office Visit: 04/13/2019  Subjective:   Kelly Burton (DOB: 07/10/1996) is a 23 y.o. female who [facility] the [facility] on 04/13/2019 in re-evaluation of the following:  HPI: [name] returns to this clinic in reevaluation of asthma with dyspnea, a possible component of hyperventilation syndrome and vocal cord dysfunction, reflux, elevated transaminase and obesity.  I last saw her in this clinic during her initial evaluation of 19 March 2019.  She is much better with her breathing at this point in time.  She does not need to use a short acting bronchodilator.  She has started walking 1-1/2 miles per day and can perform that activity without much difficulty.  Overall she feels much better.  She has not been having any issues with hyperventilation and she has not been having any issues with her throat.    Her reflux is under excellent control.  She relates a history of having weight gain over many years that she cannot lose.  She has altered her diet and as noted above has performed some exercise but she just cannot lose any weight.  During her last evaluation we did check her thyroid function which was adequate.  Allergies as of 04/13/2019   No Known Allergies     Medication List      albuterol 108 (90 Base) MCG/ACT inhaler Commonly known as:  PROVENTIL HFA;VENTOLIN HFA Inhale into the lungs.   esomeprazole 40 MG capsule Commonly known as:  NEXIUM Take 1 capsule by mouth twice daily   lisdexamfetamine 50 MG capsule Commonly known as:  VYVANSE Take 50 mg by mouth daily.   metFORMIN 500 MG 24 hr tablet Commonly known as:  GLUCOPHAGE-XR Take 1,000 mg by mouth at bedtime.   mometasone-formoterol 200-5 MCG/ACT Aero Commonly known as:  Dulera Inhale 2 puffs into the lungs 1-2 times daily    Nexplanon 68 MG Impl implant Generic drug:  etonogestrel 1 each by Subdermal route once.   zolpidem 6.25 MG CR tablet Commonly known as:  AMBIEN CR       Past Medical History:  Diagnosis Date  . Asthma   . GERD (gastroesophageal reflux disease)   . Pericarditis     Past Surgical History:  Procedure Laterality Date  . ADENOIDECTOMY    . TONSILLECTOMY    . TYMPANOSTOMY TUBE PLACEMENT      Review of systems negative except as noted in HPI / PMHx or noted below:  Review of Systems  Constitutional: Negative.   HENT: Negative.   Eyes: Negative.   Respiratory: Negative.   Cardiovascular: Negative.   Gastrointestinal: Negative.   Genitourinary: Negative.   Musculoskeletal: Negative.   Skin: Negative.   Neurological: Negative.   Endo/Heme/Allergies: Negative.   Psychiatric/Behavioral: Negative.      Objective:   Vitals:   04/13/19 1141  BP: 118/82  Pulse: 88  Resp: 16  Temp: 98.4 F (36.9 C)          Physical Exam Constitutional:      Appearance: She is not diaphoretic.  HENT:     Head: Normocephalic.     Right Ear: Tympanic membrane, ear canal and external ear normal.     Left Ear: Tympanic membrane, ear canal and external ear normal.     Nose: Nose normal. No mucosal edema or rhinorrhea.  Mouth/Throat:     Pharynx: Uvula midline. No oropharyngeal exudate.  Eyes:     Conjunctiva/sclera: Conjunctivae normal.  Neck:     Thyroid: No thyromegaly.     Trachea: Trachea normal. No tracheal tenderness or tracheal deviation.  Cardiovascular:     Rate and Rhythm: Normal rate and regular rhythm.     Heart sounds: Normal heart sounds, S1 normal and S2 normal. No murmur.  Pulmonary:     Effort: No respiratory distress.     Breath sounds: Normal breath sounds. No stridor. No wheezing or rales.  Lymphadenopathy:     Head:     Right side of head: No tonsillar adenopathy.     Left side of head: No tonsillar adenopathy.     Cervical: No cervical  adenopathy.  Skin:    Findings: Rash (Facial acne) present. No erythema.     Nails: There is no clubbing.   Neurological:     Mental Status: She is alert.     Diagnostics:    Spirometry was performed and demonstrated an FEV1 of 3.59 at 109 % of predicted.   Results of blood tests obtained 19 March 2019 identified creatinine 0.85 mg/DL, AST 09Q/Z, ALT 40 1U/L, WBC 14.2, absolute eosinophil 100, absolute lymphocyte 3000, hemoglobin 15.2, platelet 376, TSH 1.5 IU/mL, T4 1.14 NG/DL,  Assessment and Plan:   1. Asthma, moderate persistent, well-controlled   2. Hyperventilation syndrome   3. Vocal cord dysfunction   4. Physical deconditioning   5. Gastroesophageal reflux disease, esophagitis presence not specified   6. Elevated transaminase level   7. Class 2 severe obesity with serious comorbidity and body mass index (BMI) of 37.0 to 37.9 in adult, unspecified obesity type (HCC)     1.  Continue Dulera 200 - 2 inhalations 1-2 times per day depending on disease activity  2.  Continue Nexium to 40 mg twice a day  3.  Engage in progressive exercise program  4.  Use hyperventilation rebreathing technique if needed  5.  Blood - LFT panel, Hepatitis C Ab, ANA w/reflex, Anti-smooth muscle / actin Ab, anti-LKM Ab, Anti-mitochondrial Ab  6. 24 hour urine for cortisol and creatinine.  7.  Return to clinic in 12 weeks or earlier if problem  Tierica appears to be doing better with her respiratory track and we will keep her on Dulera and Nexium on a consistent basis and have her continue to engage in a progressive exercise program.  She does have a persistently elevated transaminase and we will now screen her for systemic disease contributing to that issue as noted above.  As well, she has obesity which has been a significant problem for her and regardless of dietary manipulation and exercise she cannot lose weight and we will now screen her for cortisol overproduction.  I will see her back in  this clinic in 12 weeks or earlier if there is a problem.  Laurette Schimke, MD Allergy / Immunology [facility]

## 2019-04-14 ENCOUNTER — Encounter: Payer: Self-pay | Admitting: Allergy and Immunology

## 2019-04-15 LAB — HEPATIC FUNCTION PANEL
ALT: 46 IU/L — ABNORMAL HIGH (ref 0–32)
AST: 25 IU/L (ref 0–40)
Albumin: 4.5 g/dL (ref 3.9–5.0)
Alkaline Phosphatase: 84 IU/L (ref 39–117)
Bilirubin Total: 0.5 mg/dL (ref 0.0–1.2)
Bilirubin, Direct: 0.1 mg/dL (ref 0.00–0.40)
Total Protein: 6.6 g/dL (ref 6.0–8.5)

## 2019-04-15 LAB — ENA+DNA/DS+SJORGEN'S
ENA RNP Ab: 2 AI — ABNORMAL HIGH (ref 0.0–0.9)
ENA SM Ab Ser-aCnc: <0.2 AI (ref 0.0–0.9)
ENA SSA (RO) Ab: <0.2 AI (ref 0.0–0.9)
ENA SSB (LA) Ab: <0.2 AI (ref 0.0–0.9)
dsDNA Ab: <1 [IU]/mL (ref 0–9)

## 2019-04-15 LAB — MITOCHONDRIAL/SMOOTH MUSCLE AB PNL
Mitochondrial Ab: <20 U (ref 0.0–20.0)
Smooth Muscle Ab: 4 U (ref 0–19)

## 2019-04-15 LAB — ANA W/REFLEX: Anti Nuclear Antibody (ANA): POSITIVE — AB

## 2019-04-15 LAB — ANTI-MICROSOMAL ANTIBODY LIVER / KIDNEY: LKM1 Ab: 0.5 U (ref 0.0–20.0)

## 2019-04-15 LAB — HEPATITIS C ANTIBODY: Hep C Virus Ab: <0.1 {s_co_ratio} (ref 0.0–0.9)

## 2019-04-22 DIAGNOSIS — Z6837 Body mass index (BMI) 37.0-37.9, adult: Secondary | ICD-10-CM | POA: Diagnosis not present

## 2019-04-23 ENCOUNTER — Other Ambulatory Visit: Payer: Self-pay | Admitting: Allergy and Immunology

## 2019-04-23 ENCOUNTER — Telehealth: Payer: Self-pay

## 2019-04-23 DIAGNOSIS — R945 Abnormal results of liver function studies: Principal | ICD-10-CM

## 2019-04-23 DIAGNOSIS — R7989 Other specified abnormal findings of blood chemistry: Secondary | ICD-10-CM

## 2019-04-23 NOTE — Telephone Encounter (Signed)
Please inform patient that a rheumatologist will do nothing with this isolated elevation in ENA.  There must be a constellation of various signs and symptoms for the rheumatologist to do any further evaluation.  There is one other blood test we can do in investigation of this issue.  If she has a close or distant family history of lung problems like emphysema or liver problems then we should check a alpha 1 antitrypsin level and phenotype.

## 2019-04-23 NOTE — Telephone Encounter (Signed)
Called patient and informed her of your suggestions. She stated she didn't have any family history of liver problems or lung issues, but her mother has autoimmune disorders and her aunt has lupus. She believes she has several underlying symptoms that she has been dealing with for years. I will order the suggested labs and she will come in this afternoon to pick up the orders for [facility].

## 2019-04-23 NOTE — Telephone Encounter (Signed)
See below

## 2019-04-23 NOTE — Telephone Encounter (Signed)
Patient called in regards to her lab results. 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 She was wanting to know your opinion based off the results of her lab test.   Please advise.

## 2019-04-24 LAB — CREATININE, URINE, 24 HOUR
Creatinine, 24H Ur: 1525 mg/24 hr (ref 800–1800)
Creatinine, Urine: 217.9 mg/dL

## 2019-04-25 LAB — ALPHA-1 ANTITRYPSIN PHENOTYPE: A-1 Antitrypsin: 97 mg/dL — ABNORMAL LOW (ref 100–188)

## 2019-04-27 LAB — CORTISOL, URINE, FREE
Cortisol (Ur), Free: 19 ug/24 hr (ref 6–42)
Cortisol,F,ug/L,U: 27 ug/L

## 2019-08-23 IMAGING — CR DG CHEST 2V
2 series · 2 of 2 positions shown · non-contrast
Comparison: 02/16/2016

CLINICAL DATA: Left-sided chest pain, dyspnea and asthma with
cough.

EXAM:
CHEST - 2 VIEW

[chest pa]
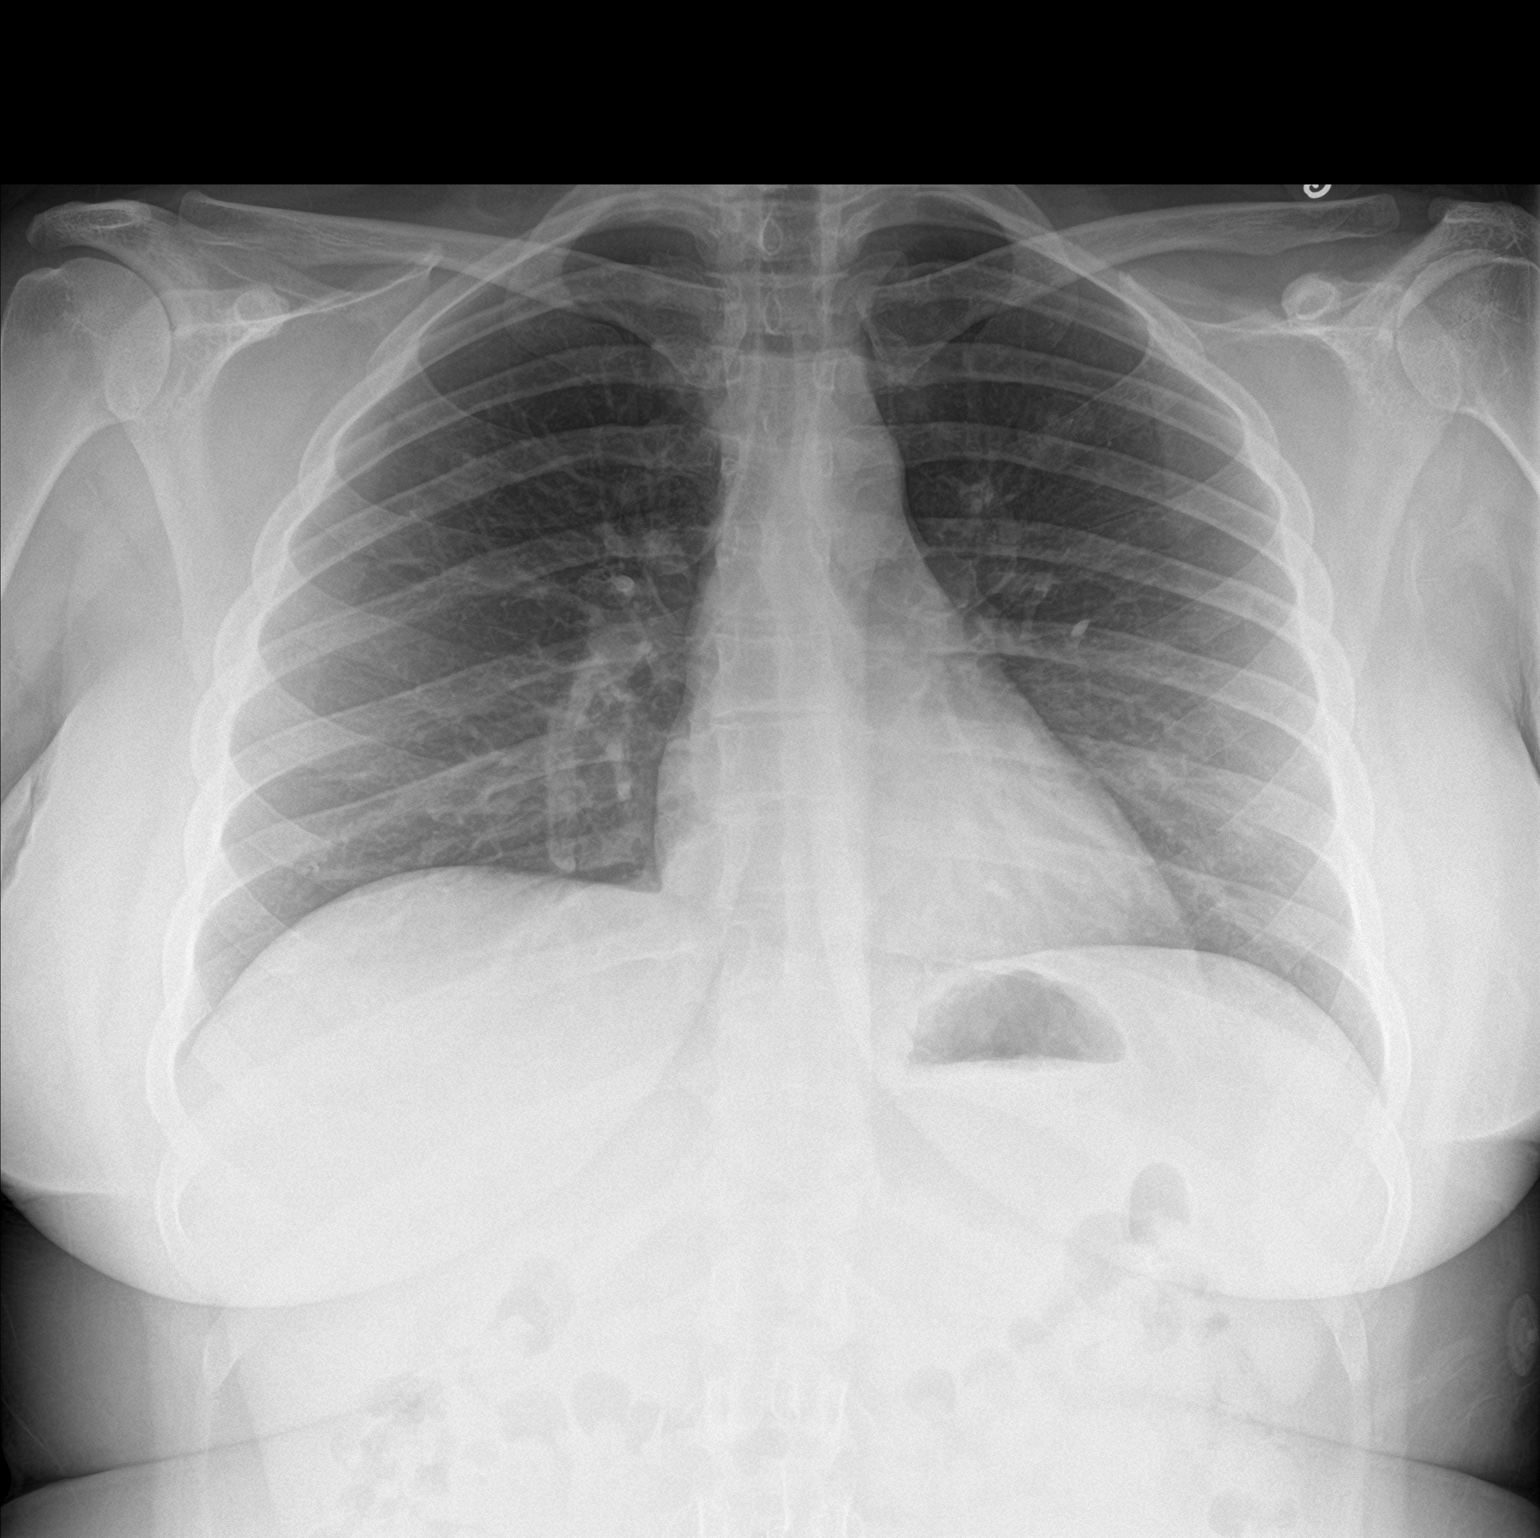

[chest lat]
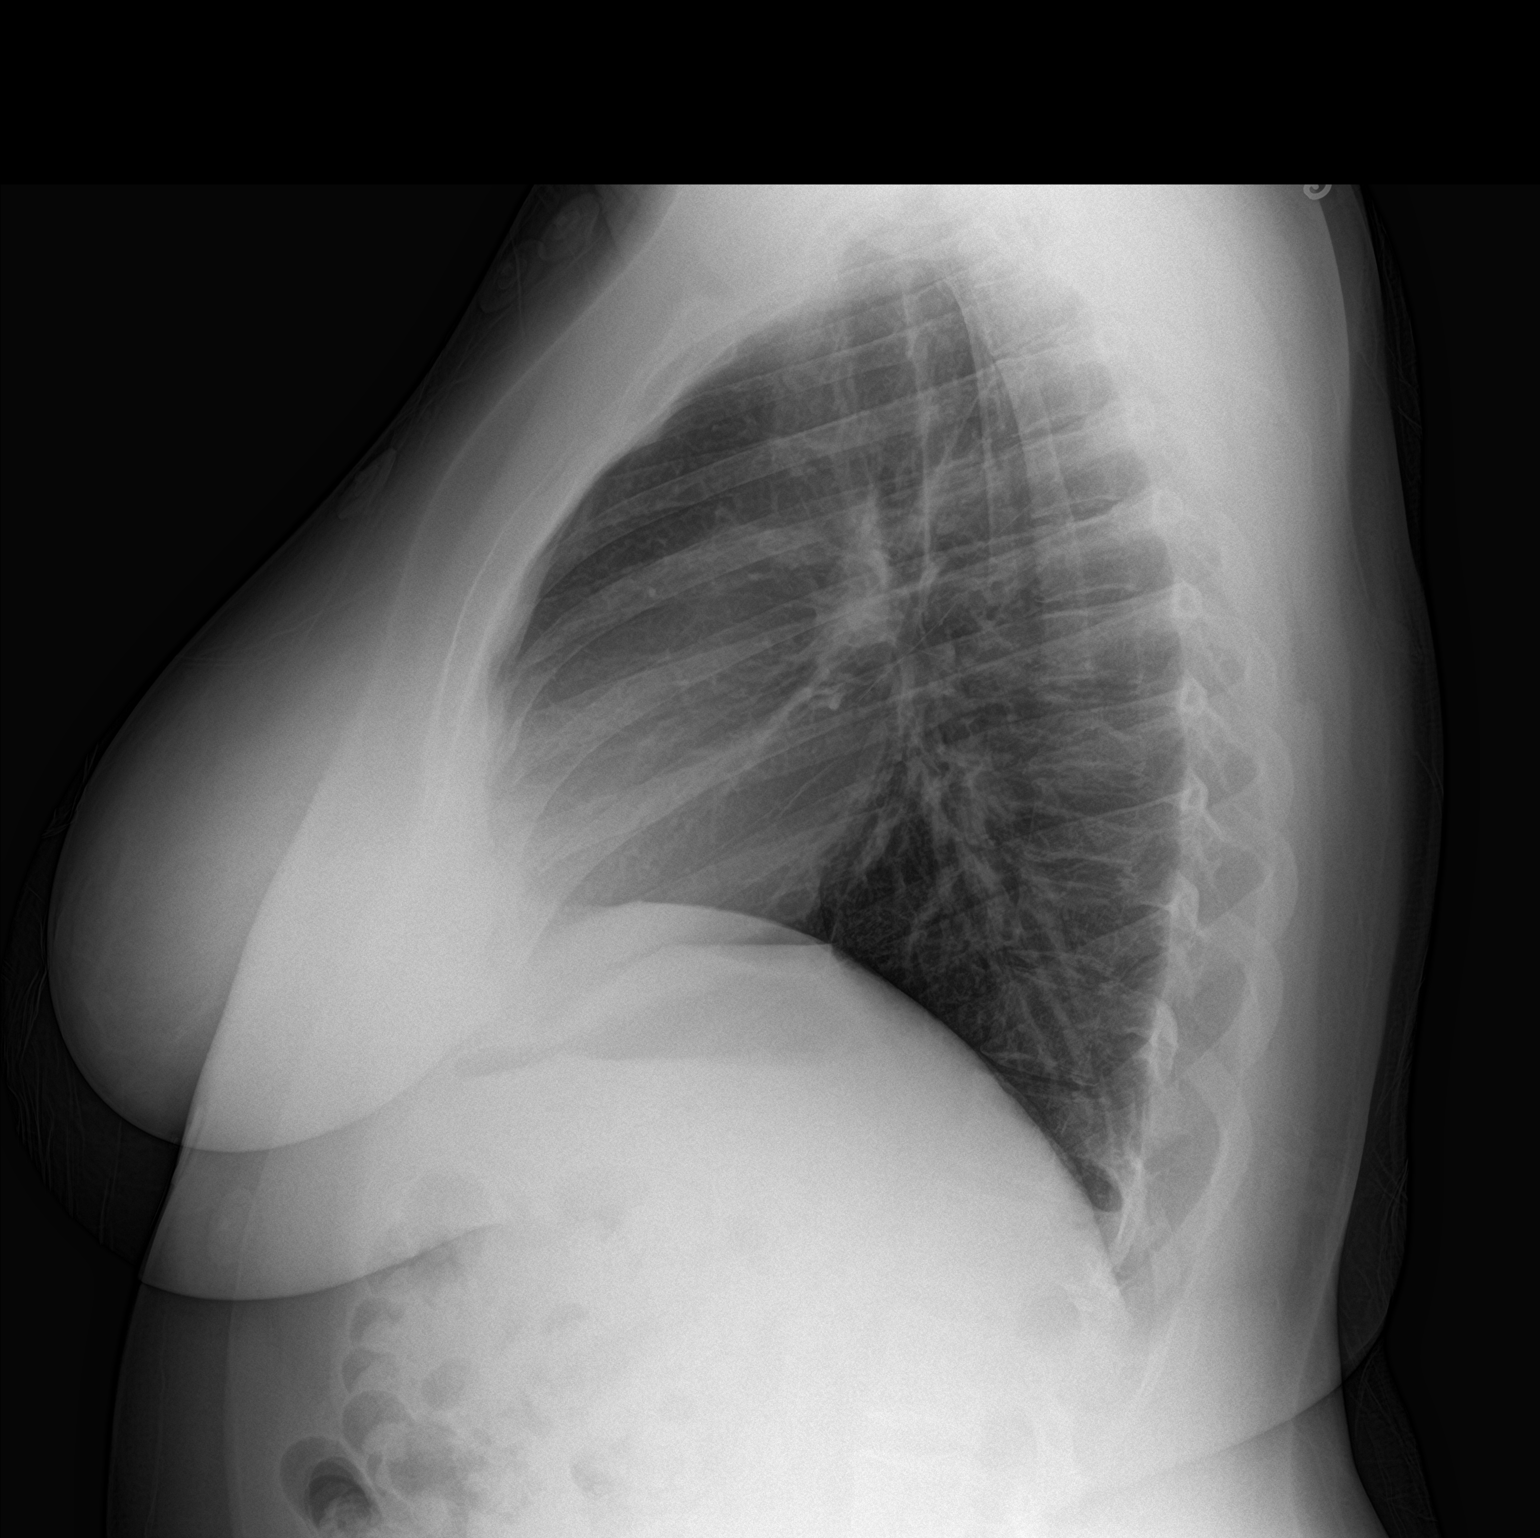

[2 of 2 positions shown; findings below may reference images not displayed]

FINDINGS: The heart size and mediastinal contours are within normal limits.
Both lungs are clear. The visualized skeletal structures are
unremarkable.
IMPRESSION: No active cardiopulmonary disease.

## 2020-01-06 ENCOUNTER — Encounter: Payer: Self-pay | Admitting: Allergy and Immunology

## 2020-01-06 ENCOUNTER — Ambulatory Visit (INDEPENDENT_AMBULATORY_CARE_PROVIDER_SITE_OTHER): Payer: 59 | Admitting: Allergy and Immunology

## 2020-01-06 ENCOUNTER — Other Ambulatory Visit: Payer: Self-pay

## 2020-01-06 VITALS — BP 124/90 | HR 96 | Temp 98.2°F | Resp 20 | Ht 64.0 in | Wt 218.0 lb

## 2020-01-06 DIAGNOSIS — R768 Other specified abnormal immunological findings in serum: Secondary | ICD-10-CM

## 2020-01-06 DIAGNOSIS — J4540 Moderate persistent asthma, uncomplicated: Secondary | ICD-10-CM

## 2020-01-06 DIAGNOSIS — E8801 Alpha-1-antitrypsin deficiency: Secondary | ICD-10-CM

## 2020-01-06 DIAGNOSIS — R7401 Elevation of levels of liver transaminase levels: Secondary | ICD-10-CM

## 2020-01-06 MED ORDER — FAMOTIDINE 40 MG PO TABS: ORAL_TABLET | ORAL | 5 refills | Status: DC

## 2020-01-06 NOTE — Progress Notes (Signed)
[place] - [place] - [place] - [place] - [place]   Follow-up Note  Referring Provider: Selena Batten * Primary Provider: Laurel Dimmer, FNP Date of Office Visit: 01/06/2020  Subjective:   Kelly Burton (DOB: 1996/10/21) is a 24 y.o. female who [facility] in re-evaluation of the following:  HPI: Kelly Burton presents to this clinic in evaluation of asthma, LPR, and a history of elevated transaminase and hyperventilation syndrome.  I last saw her in this clinic on 13 April 2019.  She contracted Covid at the tail end of November and had a prolonged event extending out about 3 to 4 weeks involving both her nose and chest and fever not requiring a hospitalization although treated with prednisone.  She is much better at this point in time and for the most part feels as though each passing week over the course of the past 2 weeks or so have been a significant improvement.  She does not need to use a short acting bronchodilator at this point.  Prior to this event her asthma was under excellent control with the use of her Elwin Sleight utilize twice a day.  She did not require systemic steroid or an antibiotic for any type of issue involving her lower airway or upper airway during the interval.  She still continues to have lots of throat clearing and slight coughing after eating.  This has been a longstanding issue of many years duration associated with throat clearing.  She is using Nexium twice a day to address her reflux and LPR.  During her last visit she was having issues with weight gain and we did check several blood tests and urine tests which did not identify any significant abnormality.  She was given Singulair at the point in time in which she was given systemic steroids for her Covid infection.  She does not think that montelukast is doing anything for her at this point and has never done anything for her when she utilized  this agent in the past.  She did receive the flu vaccine this year.  Allergies as of 01/06/2020   No Known Allergies     Medication List    albuterol 108 (90 Base) MCG/ACT inhaler Commonly known as: VENTOLIN HFA Inhale into the lungs.   esomeprazole 40 MG capsule Commonly known as: NEXIUM Take 1 capsule by mouth twice daily   lisdexamfetamine 50 MG capsule Commonly known as: VYVANSE Take 50 mg by mouth daily.   metFORMIN 500 MG 24 hr tablet Commonly known as: GLUCOPHAGE-XR Take 1,000 mg by mouth at bedtime.   mometasone-formoterol 200-5 MCG/ACT Aero Commonly known as: Dulera Inhale 2 puffs into the lungs 1-2 times daily   montelukast 10 MG tablet Commonly known as: SINGULAIR Take by mouth.       Past Medical History:  Diagnosis Date  . Asthma   . GERD (gastroesophageal reflux disease)   . Pericarditis     Past Surgical History:  Procedure Laterality Date  . ADENOIDECTOMY    . TONSILLECTOMY    . TYMPANOSTOMY TUBE PLACEMENT      Review of systems negative except as noted in HPI / PMHx or noted below:  Review of Systems  Constitutional: Negative.   HENT: Negative.   Eyes: Negative.   Respiratory: Negative.   Cardiovascular: Negative.   Gastrointestinal: Negative.   Genitourinary: Negative.   Musculoskeletal: Negative.   Skin: Negative.   Neurological: Negative.   Endo/Heme/Allergies: Negative.   Psychiatric/Behavioral:  Negative.      Objective:   Vitals:   01/06/20 1520  BP: 124/90  Pulse: 96  Resp: 20  Temp: 98.2 F (36.8 C)  SpO2: 98%   Height: 5\' 4"  (162.6 cm)  Weight: 218 lb (98.9 kg)   Physical Exam Constitutional:      Appearance: She is not diaphoretic.  HENT:     Head: Normocephalic.     Right Ear: Tympanic membrane, ear canal and external ear normal.     Left Ear: Tympanic membrane, ear canal and external ear normal.     Nose: Nose normal. No mucosal edema or rhinorrhea.     Mouth/Throat:     Pharynx: Uvula midline. No  oropharyngeal exudate.  Eyes:     Conjunctiva/sclera: Conjunctivae normal.  Neck:     Thyroid: No thyromegaly.     Trachea: Trachea normal. No tracheal tenderness or tracheal deviation.  Cardiovascular:     Rate and Rhythm: Normal rate and regular rhythm.     Heart sounds: Normal heart sounds, S1 normal and S2 normal. No murmur.  Pulmonary:     Effort: No respiratory distress.     Breath sounds: Normal breath sounds. No stridor. No wheezing or rales.  Lymphadenopathy:     Head:     Right side of head: No tonsillar adenopathy.     Left side of head: No tonsillar adenopathy.     Cervical: No cervical adenopathy.  Skin:    Findings: No erythema or rash.     Nails: There is no clubbing.  Neurological:     Mental Status: She is alert.     Diagnostics:     Spirometry was performed and demonstrated an FEV1 of 3.03 at 92 % of predicted.  Results of blood tests obtained 13 April 2019 identified negative smooth muscle antibody, negative mitochondrial antibody, negative LK M1 antibody, negative hepatitis C virus antibody, positive ANA with ENA RNP 2.0U/mL  Results of blood tests obtained on 23 April 2019 identified alpha 1 antitrypsin phenotype MZ with a level of 97 mg/DL,  Results of 25 April 2019 urine collection obtained 22 April 2019 identified free cortisol 19 UG/24-hour  Assessment and Plan:   1. Asthma, moderate persistent, well-controlled   2. Elevated transaminase level   3. Heterozygous alpha 1-antitrypsin deficiency (HCC)   4. Positive sm/RNP antibody     1.  Continue Dulera 200 - 2 inhalations 1-2 times per day depending on disease activity  2.  Treat LPR with the following:   A. Continue Nexium to 40 mg twice a day  B. Start Famotidine 40 mg in evening  C. Replace throat clearing with swallowing maneuver  3.  If needed:   A. Albuterol HFA  B. OTC antihistamine  4. Return to clinic in 6 months or earlier if problem  Kelly Burton appears to be doing relatively well  regarding her airway issue on her anti-inflammatory agents for her airway although her reflux still appears to be producing some degree of LPR and I have made a suggestion about things he can do to address this issue as noted above.  As well, she is a alpha 1 antitrypsin MZ heterozygote and she is at risk for insults to both her airway and liver as a result of this heterozygote status.  In most cases heterozygote status does not produce significant lung or liver disease but definitely predisposes individuals to more severe organ insults that can result from some other process.  In Kelly Burton's case she appears to have  fatty liver and I did have a talk with her in the past about the need to undergo a weight reduction plan to address this issue.  She does have a positive ENA RNP but at this point does not appear to have any symptoms suggesting a mixed connective tissue disease or other autoimmunity.  I will see her back in this clinic in 6 months or earlier if there is a problem.  We will recheck her liver function tests and RNP titer during that visit.  Allena Katz, MD Allergy / Immunology [place]

## 2020-01-06 NOTE — Patient Instructions (Addendum)
  1.  Continue Dulera 200 - 2 inhalations 1-2 times per day depending on disease activity  2.  Treat LPR with the following:   A. Continue Nexium to 40 mg twice a day  B. Start Famotidine 40 mg in evening  C. Replace throat clearing with swallowing maneuver  3.  If needed:   A. Albuterol HFA  B. OTC antihistamine  4. Return to clinic in 6 months or earlier if problem

## 2020-01-07 ENCOUNTER — Encounter: Payer: Self-pay | Admitting: Allergy and Immunology

## 2020-09-19 ENCOUNTER — Other Ambulatory Visit: Payer: Self-pay | Admitting: *Deleted

## 2020-09-19 ENCOUNTER — Telehealth: Payer: Self-pay | Admitting: Allergy and Immunology

## 2020-09-19 MED ORDER — DULERA 200-5 MCG/ACT IN AERO: INHALATION_SPRAY | RESPIRATORY_TRACT | 0 refills | Status: DC

## 2020-09-19 NOTE — Telephone Encounter (Signed)
Kelly Burton came in and stated she is in between insurances and would like for [place] to send [facility] to [facility] because they are the only pharmacy that will pro rate her insurance coverage and cover her medications.  I did tell Kelly Burton she was due for an appointment and she stated once the insurance kicks in and she gets that straight she will make an appointment.

## 2020-09-19 NOTE — Telephone Encounter (Signed)
Rx SENT

## 2020-10-17 MED ORDER — BUDESONIDE-FORMOTEROL FUMARATE 160-4.5 MCG/ACT IN AERO: 2.0000 | INHALATION_SPRAY | Freq: Two times a day (BID) | RESPIRATORY_TRACT | 5 refills | Status: DC

## 2020-10-17 NOTE — Telephone Encounter (Signed)
Symbicort 160 - 2 inhalations every 12 hours

## 2020-10-17 NOTE — Telephone Encounter (Signed)
Prescription has been sent in and patient is aware of change. She is agreeable with plan.

## 2020-10-17 NOTE — Telephone Encounter (Signed)
Patient called in with needed insurance info: ID [id number]  GRP [id number] BIN [id number]  I have submitted the PA, but patient has only tried and failed Advair Diskus & Qvar. I did let her know that she may have to switch to [place], Symbicort or Advair HFA if insurance will not cover the [facility].Patient did verbalize understanding of this.

## 2020-10-17 NOTE — Addendum Note (Signed)
Addended by: Mliss Fritz I on: 10/17/2020 12:09 PM   Modules accepted: Orders

## 2020-10-17 NOTE — Telephone Encounter (Signed)
Prior Berkley Harvey has been denied. Please advise on alternative choice of: Symbicort, Breo Ellipta or Advair HFA

## 2021-05-09 ENCOUNTER — Other Ambulatory Visit: Payer: Self-pay | Admitting: Allergy and Immunology

## 2021-06-20 ENCOUNTER — Other Ambulatory Visit: Payer: Self-pay | Admitting: Allergy and Immunology

## 2021-06-21 ENCOUNTER — Ambulatory Visit (INDEPENDENT_AMBULATORY_CARE_PROVIDER_SITE_OTHER): Payer: Managed Care, Other (non HMO) | Admitting: Allergy and Immunology

## 2021-06-21 ENCOUNTER — Other Ambulatory Visit: Payer: Self-pay

## 2021-06-21 ENCOUNTER — Encounter: Payer: Self-pay | Admitting: Allergy and Immunology

## 2021-06-21 VITALS — BP 104/66 | HR 78 | Temp 97.4°F | Resp 16 | Ht 65.5 in | Wt 220.6 lb

## 2021-06-21 DIAGNOSIS — J4540 Moderate persistent asthma, uncomplicated: Secondary | ICD-10-CM | POA: Diagnosis not present

## 2021-06-21 DIAGNOSIS — R7401 Elevation of levels of liver transaminase levels: Secondary | ICD-10-CM

## 2021-06-21 DIAGNOSIS — J3089 Other allergic rhinitis: Secondary | ICD-10-CM

## 2021-06-21 DIAGNOSIS — R768 Other specified abnormal immunological findings in serum: Secondary | ICD-10-CM

## 2021-06-21 DIAGNOSIS — E8801 Alpha-1-antitrypsin deficiency: Secondary | ICD-10-CM

## 2021-06-21 DIAGNOSIS — K219 Gastro-esophageal reflux disease without esophagitis: Secondary | ICD-10-CM | POA: Diagnosis not present

## 2021-06-21 MED ORDER — ESOMEPRAZOLE MAGNESIUM 40 MG PO CPDR: DELAYED_RELEASE_CAPSULE | ORAL | 5 refills | Status: DC

## 2021-06-21 MED ORDER — SYMBICORT 160-4.5 MCG/ACT IN AERO: INHALATION_SPRAY | RESPIRATORY_TRACT | 5 refills | Status: DC

## 2021-06-21 MED ORDER — MONTELUKAST SODIUM 10 MG PO TABS: ORAL_TABLET | ORAL | 5 refills | Status: DC

## 2021-06-21 MED ORDER — FAMOTIDINE 40 MG PO TABS: ORAL_TABLET | ORAL | 5 refills | Status: DC

## 2021-06-21 NOTE — Progress Notes (Signed)
[place] - [place] - [place] - [place] - [place]   Follow-up Note  Referring Provider: Selena Batten * Primary Provider: Laurel Dimmer, FNP Date of Office Visit: 06/21/2021  Subjective:   Kelly Burton (DOB: April 12, 1996) is a 25 y.o. female who [facility] 06[facility]/2021 in re-evaluation of the following:  HPI: Kelly Burton presents to this clinic in evaluation of asthma and LPR and history of elevated liver function tests and elevated RNP in the context of heterozygous alpha 1 antitrypsin deficiency.  I last saw her in this clinic on 06 January 2020.  During her last visit she was having an issue with LPR and we added famotidine to her Nexium twice a day and had her replace her throat clearing with a swallowing maneuver and she has done quite well and most of her throat issue has completely resolved.  Likewise, most of her reflux issue has resolved as well on this plan.  Her asthma has been under excellent control.  She is currently using her Symbicort mostly 1 time per day and rarely uses a short acting bronchodilator and has not required a systemic steroid to treat an exacerbation and can exert herself without any difficulty.  She is a little bit stuffy at night and in the morning ever since she purchased a down comforter about 1 month ago.  She is using montelukast to treat her upper airway disease at this point.  She has a history of elevated liver function test believed to be secondary to fatty liver and she has a history of elevated RNP antibody without associated connective tissue signs or symptoms.  Allergies as of 06/21/2021       Reactions   Tape Rash        Medication List      albuterol 108 (90 Base) MCG/ACT inhaler Commonly known as: VENTOLIN HFA Inhale into the lungs.   cyanocobalamin 1000 MCG/ML injection Commonly known as: (VITAMIN B-12) SMARTSIG:1 Milliliter(s) IM   esomeprazole 40 MG capsule Commonly  known as: NEXIUM Take 1 capsule by mouth twice daily   famotidine 40 MG tablet Commonly known as: PEPCID Can take one tablet by mouth daily in the evening if needed.   lisdexamfetamine 50 MG capsule Commonly known as: VYVANSE Take 50 mg by mouth daily.   metFORMIN 500 MG 24 hr tablet Commonly known as: GLUCOPHAGE-XR Take 1,000 mg by mouth at bedtime.   montelukast 10 MG tablet Commonly known as: SINGULAIR Take one tablet by mouth once daily as directed.   Symbicort 160-4.5 MCG/ACT inhaler Generic drug: budesonide-formoterol Inhale two puffs one to two times daily as directed to prevent cough or wheeze.  Rinse, gargle, and spit after use.         Past Medical History:  Diagnosis Date   Asthma    GERD (gastroesophageal reflux disease)    Pericarditis     Past Surgical History:  Procedure Laterality Date   ADENOIDECTOMY     TONSILLECTOMY     TYMPANOSTOMY TUBE PLACEMENT      Review of systems negative except as noted in HPI / PMHx or noted below:  Review of Systems  Constitutional: Negative.   HENT: Negative.    Eyes: Negative.   Respiratory: Negative.    Cardiovascular: Negative.   Gastrointestinal: Negative.   Genitourinary: Negative.   Musculoskeletal: Negative.   Skin: Negative.   Neurological: Negative.   Endo/Heme/Allergies: Negative.   Psychiatric/Behavioral: Negative.      Objective:  Vitals:   06/21/21 1027  BP: 104/66  Pulse: 78  Resp: 16  Temp: (!) 97.4 F (36.3 C)  SpO2: 98%   Height: 5' 5.5" (166.4 cm)  Weight: 220 lb 9.6 oz (100.1 kg)   Physical Exam Constitutional:      Appearance: She is not diaphoretic.  HENT:     Head: Normocephalic.     Right Ear: Tympanic membrane, ear canal and external ear normal.     Left Ear: Tympanic membrane, ear canal and external ear normal.     Nose: Nose normal. No mucosal edema or rhinorrhea.     Mouth/Throat:     Pharynx: Uvula midline. No oropharyngeal exudate.  Eyes:      Conjunctiva/sclera: Conjunctivae normal.  Neck:     Thyroid: No thyromegaly.     Trachea: Trachea normal. No tracheal tenderness or tracheal deviation.  Cardiovascular:     Rate and Rhythm: Normal rate and regular rhythm.     Heart sounds: Normal heart sounds, S1 normal and S2 normal. No murmur heard. Pulmonary:     Effort: No respiratory distress.     Breath sounds: Normal breath sounds. No stridor. No wheezing or rales.  Lymphadenopathy:     Head:     Right side of head: No tonsillar adenopathy.     Left side of head: No tonsillar adenopathy.     Cervical: No cervical adenopathy.  Skin:    Findings: No erythema or rash.     Nails: There is no clubbing.  Neurological:     Mental Status: She is alert.    Diagnostics:    Spirometry was performed and demonstrated an FEV1 of 3.19 at 95 % of predicted.   Assessment and Plan:   1. Asthma, moderate persistent, well-controlled   2. Perennial allergic rhinitis   3. LPRD (laryngopharyngeal reflux disease)   4. Elevated transaminase level   5. Heterozygous alpha 1-antitrypsin deficiency (HCC)   6. Positive sm/RNP antibody     1.  Continue Symbicort 160 - 2 inhalations 1-2 times per day depending on disease activity  2. Continue Montelukast 10 mg - 1 tablet 1 time per day  3.  Continue to Treat LPR with the following:   A. Nexium to 40 mg twice a day  B. Famotidine 40 mg in evening if needed  4.  If needed:   A. Albuterol HFA  B. OTC antihistamine  5. Put down comforter in dryer weekly  6. Can use OTC Nasacort - 1-2 sprays each nostril 3-7 times per week. Takes days to work  7. Return to clinic in 6 months or earlier if problem  8. Blood - LFT panel, ENA panel  Overall [name] appears to be doing relatively well regarding her airway issue on her current plan.  She has had a little bit more rhinitis and she purchased a down comforter and I asked her to place to down comforter in the dryer at least on a weekly basis and  she can always restart a nasal steroid using over-the-counter Nasacort.  Her reflux appears to be under pretty good control at this point in time as well on her current plan.  We will follow-up her elevated liver function test and elevated RNP with the blood test noted above.  I will see her back in his clinic in 6 months or earlier if there is a problem.  Laurette Schimke, MD Allergy / Immunology [facility]

## 2021-06-21 NOTE — Patient Instructions (Addendum)
  1.  Continue Symbicort 160 - 2 inhalations 1-2 times per day depending on disease activity  2. Continue Montelukast 10 mg - 1 tablet 1 time per day  3.  Continue to Treat LPR with the following:   A. Nexium to 40 mg twice a day  B. Famotidine 40 mg in evening if needed  4.  If needed:   A. Albuterol HFA  B. OTC antihistamine  5. Put down comforter in dryer weekly  6. Can use OTC Nasacort - 1-2 sprays each nostril 3-7 times per week. Takes days to work  7. Return to clinic in 6 months or earlier if problem  8. Blood - LFT panel, ENA panel

## 2021-06-22 ENCOUNTER — Encounter: Payer: Self-pay | Admitting: Allergy and Immunology

## 2021-06-28 LAB — ANTI-ENA6 PLUS DFS70AB PROFILE
Anti-DFS70 Ab: <20 Units (ref <20)
Anti-Jo-1 Ab (RDL): <20 Units (ref <20)
Anti-La (SS-B) Ab (RDL): <20 Units (ref <20)
Anti-Ro (SS-A) Ab (RDL): <20 Units (ref <20)
Anti-Scl-70 Ab (RDL): <20 Units (ref <20)
Anti-Sm Ab (RDL): <20 Units (ref <20)
Anti-U1 RNP Ab (RDL): <20 Units (ref <20)

## 2021-06-28 LAB — HEPATIC FUNCTION PANEL
ALT: 39 IU/L — ABNORMAL HIGH (ref 0–32)
AST: 20 IU/L (ref 0–40)
Albumin: 4.6 g/dL (ref 3.9–5.0)
Alkaline Phosphatase: 81 IU/L (ref 44–121)
Bilirubin Total: 0.3 mg/dL (ref 0.0–1.2)
Bilirubin, Direct: 0.12 mg/dL (ref 0.00–0.40)
Total Protein: 7 g/dL (ref 6.0–8.5)

## 2021-07-10 ENCOUNTER — Telehealth: Payer: Self-pay | Admitting: Allergy and Immunology

## 2021-07-10 NOTE — Telephone Encounter (Signed)
Patient was returning a call about her lab results.

## 2021-07-11 NOTE — Telephone Encounter (Signed)
Called patient and informed her of results.  She wanted to let Dr. Lucie Leather know that she recently got back from a cruise and her roommate tested positive for COVID. Last Sunday Kelly Burton started with symptoms including bad coughing with thick whitish/yellow sputum, headache, body aches, fever, chills, stuffy nose.  She did take an at-home COVID test Sunday and it was positive.  Patient is using her medications and has added in Dayquil and Tylenol.  Please advise.

## 2021-07-12 MED ORDER — PAXLOVID 20 X 150 MG & 10 X 100MG PO TBPK: 3.0000 | ORAL_TABLET | Freq: Two times a day (BID) | ORAL | 0 refills | Status: AC

## 2021-07-12 NOTE — Addendum Note (Signed)
Addended by: Alphonzo Cruise on: 07/12/2021 01:10 PM   Modules accepted: Orders

## 2021-07-12 NOTE — Addendum Note (Signed)
Addended by: Alphonzo Cruise on: 07/12/2021 02:04 PM   Modules accepted: Orders

## 2021-07-12 NOTE — Telephone Encounter (Signed)
Lets give her Paxlovid- 2 times per day for 5 days

## 2021-07-12 NOTE — Telephone Encounter (Signed)
Symptoms began Sunday, July 10th and patient has received two Moderna vaccinations near the end of 2021. Please advise.

## 2021-07-12 NOTE — Telephone Encounter (Signed)
Called and informed patient of RX and sent RX to [facility] on [address]  I did call [facility] and they have Paxlovid in stock.

## 2021-07-12 NOTE — Telephone Encounter (Signed)
How many days has it been since start of symptoms? What is her immunization status for Covid?

## 2022-05-31 ENCOUNTER — Other Ambulatory Visit: Payer: Self-pay | Admitting: Allergy and Immunology

## 2022-07-17 ENCOUNTER — Other Ambulatory Visit: Payer: Self-pay | Admitting: Allergy and Immunology

## 2022-07-26 ENCOUNTER — Encounter: Payer: Self-pay | Admitting: Allergy and Immunology

## 2022-07-26 ENCOUNTER — Ambulatory Visit: Payer: BC Managed Care – PPO | Admitting: Allergy and Immunology

## 2022-07-26 VITALS — BP 118/62 | HR 75 | Resp 16 | Ht 65.5 in | Wt 210.0 lb

## 2022-07-26 DIAGNOSIS — R768 Other specified abnormal immunological findings in serum: Secondary | ICD-10-CM

## 2022-07-26 DIAGNOSIS — G8929 Other chronic pain: Secondary | ICD-10-CM

## 2022-07-26 DIAGNOSIS — R7401 Elevation of levels of liver transaminase levels: Secondary | ICD-10-CM | POA: Diagnosis not present

## 2022-07-26 DIAGNOSIS — M546 Pain in thoracic spine: Secondary | ICD-10-CM

## 2022-07-26 DIAGNOSIS — E8801 Alpha-1-antitrypsin deficiency: Secondary | ICD-10-CM | POA: Diagnosis not present

## 2022-07-26 DIAGNOSIS — J3089 Other allergic rhinitis: Secondary | ICD-10-CM

## 2022-07-26 DIAGNOSIS — J4540 Moderate persistent asthma, uncomplicated: Secondary | ICD-10-CM | POA: Diagnosis not present

## 2022-07-26 DIAGNOSIS — K219 Gastro-esophageal reflux disease without esophagitis: Secondary | ICD-10-CM

## 2022-07-26 MED ORDER — ALBUTEROL SULFATE (2.5 MG/3ML) 0.083% IN NEBU: 2.5000 mg | INHALATION_SOLUTION | RESPIRATORY_TRACT | 1 refills | Status: AC | PRN

## 2022-07-26 MED ORDER — FAMOTIDINE 40 MG PO TABS: ORAL_TABLET | ORAL | 5 refills | Status: DC

## 2022-07-26 MED ORDER — MONTELUKAST SODIUM 10 MG PO TABS: ORAL_TABLET | ORAL | 5 refills | Status: DC

## 2022-07-26 MED ORDER — BUDESONIDE-FORMOTEROL FUMARATE 160-4.5 MCG/ACT IN AERO: INHALATION_SPRAY | RESPIRATORY_TRACT | 5 refills | Status: DC

## 2022-07-26 MED ORDER — ALBUTEROL SULFATE HFA 108 (90 BASE) MCG/ACT IN AERS: INHALATION_SPRAY | RESPIRATORY_TRACT | 2 refills | Status: AC

## 2022-07-26 MED ORDER — ESOMEPRAZOLE MAGNESIUM 40 MG PO CPDR: DELAYED_RELEASE_CAPSULE | ORAL | 5 refills | Status: DC

## 2022-07-26 NOTE — Patient Instructions (Signed)
  1.  Continue to treat and prevent inflammation:  A. Symbicort 160 - 2 inhalations 1-2 times per day   B. Montelukast 10 mg - 1 tablet 1 time per day C. OTC Nasacort - 1-2 sprays each nostril 3-7 times per week.  2.  Continue to Treat LPR:   A. Nexium to 40 mg twice a day  B. Famotidine 40 mg in evening if needed  4.  If needed:   A. Albuterol HFA  B. OTC antihistamine  5. Blood - LFT panel, ENA panel, alpha-1 antitrypsin level  6. Referral to [facility] orthopedics for left thoracic pain  7. Obtain fall flu vaccine  8. Return to clinic in 1 year or earlier if problem

## 2022-07-26 NOTE — Progress Notes (Signed)
[place] - [place] - [place] - [place] - Kelly Burton   Follow-up Note  Referring Provider: Selena Batten * Primary Provider: Laurel Dimmer, FNP Date of Office Visit: 07/26/2022  Subjective:   Kelly Burton (DOB: [birth year]) is a 26 y.o. female who [facility] in re-evaluation of the following:  HPI: [name] presents to this clinic in evaluation of asthma, LPR, elevated liver function test, elevated RNP, heterozygous alpha 1 antitrypsin deficiency (MZ).  I last saw her in this clinic on 21 June 2021.  Her airway has really done very well since her last visit and she has been able to taper down her Symbicort to just 1 time per day and occasionally uses some nasal steroid while she continues on montelukast and has not required a systemic steroid or antibiotic for any type of airway issue and rarely uses any short acting bronchodilator.  Her reflux has also been under excellent control.  She has not had any significant classic symptoms of reflux and her throat has really been doing very well.  She did have an episode of stridor with a very irritated throat on occasion but this is very rare.  She has not had follow-up regarding her alpha-1 antitrypsin issue or her elevated liver function tests with her GI doctor in several years.  She informs me that she has developed a problem with her left thoracic region over the course of the past 6 months.  She has a pinpoint pain around the area of the scapula that does not appear to get better or worse with movement of her torso or arms or neck but does wax and wane in intensity and she has been treated with a systemic steroid and given some muscle relaxers for this issue.  She has apparently had a chest x-ray and she has had thoracic spine x-rays which apparently been normal.  Allergies as of 07/26/2022       Reactions   Tape Rash        Medication List    albuterol 108 (90  Base) MCG/ACT inhaler Commonly known as: VENTOLIN HFA Inhale into the lungs.   budesonide-formoterol 160-4.5 MCG/ACT inhaler Commonly known as: SYMBICORT Inhale two puffs one to two times daily as directed to prevent cough or wheeze. Rinse, gargle, and spit after use.   cyanocobalamin 1000 MCG/ML injection Commonly known as: VITAMIN B12 SMARTSIG:1 Milliliter(s) IM   esomeprazole 40 MG capsule Commonly known as: NEXIUM Take 1 capsule by mouth twice daily   famotidine 40 MG tablet Commonly known as: PEPCID Can take one tablet by mouth daily in the evening if needed.   lisdexamfetamine 50 MG capsule Commonly known as: VYVANSE Take 50 mg by mouth daily.   metFORMIN 500 MG 24 hr tablet Commonly known as: GLUCOPHAGE-XR Take 1,000 mg by mouth at bedtime.   montelukast 10 MG tablet Commonly known as: SINGULAIR Take one tablet by mouth once daily as directed.     Past Medical History:  Diagnosis Date   Asthma    GERD (gastroesophageal reflux disease)    Pericarditis     Past Surgical History:  Procedure Laterality Date   ADENOIDECTOMY     TONSILLECTOMY     TYMPANOSTOMY TUBE PLACEMENT      Review of systems negative except as noted in HPI / PMHx or noted below:  Review of Systems  Constitutional: Negative.   HENT: Negative.    Eyes: Negative.   Respiratory: Negative.  Cardiovascular: Negative.   Gastrointestinal: Negative.   Genitourinary: Negative.   Musculoskeletal: Negative.   Skin: Negative.   Neurological: Negative.   Endo/Heme/Allergies: Negative.   Psychiatric/Behavioral: Negative.       Objective:   Vitals:   07/26/22 1215  BP: 118/62  Pulse: 75  Resp: 16  SpO2: 99%   Height: 5' 5.5" (166.4 cm)  Weight: 210 lb (95.3 kg)   Physical Exam Constitutional:      Appearance: She is not diaphoretic.  HENT:     Head: Normocephalic.     Right Ear: Tympanic membrane, ear canal and external ear normal.     Left Ear: Tympanic membrane, ear canal  and external ear normal.     Nose: Nose normal. No mucosal edema or rhinorrhea.     Mouth/Throat:     Pharynx: Uvula midline. No oropharyngeal exudate.  Eyes:     Conjunctiva/sclera: Conjunctivae normal.  Neck:     Thyroid: No thyromegaly.     Trachea: Trachea normal. No tracheal tenderness or tracheal deviation.  Cardiovascular:     Rate and Rhythm: Normal rate and regular rhythm.     Heart sounds: Normal heart sounds, S1 normal and S2 normal. No murmur heard. Pulmonary:     Effort: No respiratory distress.     Breath sounds: Normal breath sounds. No stridor. No wheezing or rales.  Musculoskeletal:        General: No tenderness (No pinpoint tenderness of left medial scapular or thoracic palpation).  Lymphadenopathy:     Head:     Right side of head: No tonsillar adenopathy.     Left side of head: No tonsillar adenopathy.     Cervical: No cervical adenopathy.  Skin:    Findings: No erythema or rash.     Nails: There is no clubbing.  Neurological:     Mental Status: She is alert.     Diagnostics:    Spirometry was performed and demonstrated an FEV1 of 2.99 at 89 % of predicted.  Results of blood tests obtained 21 June 2021 identifies AST 20 U/L, ALT 39 U/L, bilirubin 0.3 mg/DL, RNP antibody less than 20 U/L,  Assessment and Plan:   1. Heterozygous alpha 1-antitrypsin deficiency (HCC)   2. Positive sm/RNP antibody   3. Elevated transaminase level   4. Asthma, moderate persistent, well-controlled   5. Perennial allergic rhinitis   6. LPRD (laryngopharyngeal reflux disease)   7. Chronic left-sided thoracic back pain    1.  Continue to treat and prevent inflammation:  A. Symbicort 160 - 2 inhalations 1-2 times per day   B. Montelukast 10 mg - 1 tablet 1 time per day C. OTC Nasacort - 1-2 sprays each nostril 3-7 times per week.  2.  Continue to Treat LPR:   A. Nexium to 40 mg twice a day  B. Famotidine 40 mg in evening if needed  4.  If needed:   A. Albuterol  HFA  B. OTC antihistamine  5. Blood - LFT panel, ENA panel, alpha-1 antitrypsin level  6. Referral to [facility] orthopedics for left thoracic pain  7. Obtain fall flu vaccine  8. Return to clinic in 1 year or earlier if problem  [name] is doing very well from a respiratory and a reflux standpoint.  She appears to have this left thoracic pain and we will refer her onto orthopedics for further evaluation of this issue and possibly refer her onto a chiropractor if only a benign etiologic factor can be identified  giving rise to this issue.  I am going to follow-up her alpha-1 antitrypsin level and her transaminase elevations and her history of elevated RNP with the blood test noted above.  Laurette Schimke, MD Allergy / Immunology [facility]

## 2022-07-30 ENCOUNTER — Encounter: Payer: Self-pay | Admitting: Allergy and Immunology

## 2022-07-30 ENCOUNTER — Telehealth: Payer: Self-pay

## 2022-07-30 NOTE — Telephone Encounter (Signed)
Referral has been faxed over to:   [facility]  [address] [place], [place] [address] Phone: [phone] Fax: [phone]   Patient informed.

## 2022-07-31 LAB — ANA+ENA+DNA/DS+SCL 70+SJOSSA/B
ANA Titer 1: NEGATIVE
ENA RNP Ab: 1.9 AI — ABNORMAL HIGH (ref 0.0–0.9)
ENA SM Ab Ser-aCnc: <0.2 AI (ref 0.0–0.9)
ENA SSA (RO) Ab: <0.2 AI (ref 0.0–0.9)
ENA SSB (LA) Ab: <0.2 AI (ref 0.0–0.9)
Scleroderma (Scl-70) (ENA) Antibody, IgG: <0.2 AI (ref 0.0–0.9)
dsDNA Ab: <1 IU/mL (ref 0–9)

## 2022-07-31 LAB — HEPATIC FUNCTION PANEL
ALT: 34 IU/L — ABNORMAL HIGH (ref 0–32)
AST: 19 IU/L (ref 0–40)
Albumin: 4.7 g/dL (ref 4.0–5.0)
Alkaline Phosphatase: 75 IU/L (ref 44–121)
Bilirubin Total: 0.3 mg/dL (ref 0.0–1.2)
Bilirubin, Direct: <0.1 mg/dL (ref 0.00–0.40)
Total Protein: 7 g/dL (ref 6.0–8.5)

## 2022-07-31 LAB — ALPHA-1-ANTITRYPSIN PHENOTYP: A-1 Antitrypsin: 98 mg/dL — ABNORMAL LOW (ref 100–188)

## 2023-02-27 ENCOUNTER — Other Ambulatory Visit: Payer: Self-pay | Admitting: Allergy and Immunology

## 2023-04-10 ENCOUNTER — Encounter: Payer: Self-pay | Admitting: Allergy and Immunology

## 2023-04-10 ENCOUNTER — Ambulatory Visit: Payer: BC Managed Care – PPO | Admitting: Allergy and Immunology

## 2023-04-10 VITALS — BP 110/68 | HR 84 | Resp 16

## 2023-04-10 DIAGNOSIS — J4540 Moderate persistent asthma, uncomplicated: Secondary | ICD-10-CM

## 2023-04-10 DIAGNOSIS — K219 Gastro-esophageal reflux disease without esophagitis: Secondary | ICD-10-CM

## 2023-04-10 DIAGNOSIS — E8801 Alpha-1-antitrypsin deficiency: Secondary | ICD-10-CM | POA: Diagnosis not present

## 2023-04-10 DIAGNOSIS — J3089 Other allergic rhinitis: Secondary | ICD-10-CM | POA: Diagnosis not present

## 2023-04-10 DIAGNOSIS — R768 Other specified abnormal immunological findings in serum: Secondary | ICD-10-CM

## 2023-04-10 NOTE — Progress Notes (Unsigned)
[place] - [place] - [place] - [place] - [place] Ace   Follow-up Note  Referring Provider: Selena Batten * Primary Provider: Laurel Dimmer, FNP Date of Office Visit: 04/10/2023  Subjective:   Kelly Burton (DOB: Feb 17, 1996) is a 27 y.o. female who [facility]23 in re-evaluation of the following:  HPI: Kelly Burton returns to this clinic in reevaluation of asthma, LPR, elevated liver function test, elevated RNP, MZ heterozygous alpha-1 antitrypsin deficiency genotype.  I last saw her in this clinic 26 July 2022.  Her airway issue has been under excellent control and she has not had the need to use a systemic steroid or an antibiotic for any type of airway issue and she rarely uses a short acting bronchodilator and can exert herself without any problem while using Symbicort mostly twice a day and montelukast on a consistent basis and some occasional nasal steroid.  Her reflux is under very good control at this point by using Nexium currently 1 time per day and continuing on famotidine in the evening.  She is scheduled for repair of a left rotator cuff tear via arthroscopic approach sometime in the near future.  Allergies as of 04/10/2023       Reactions   Tape Rash        Medication List    albuterol 108 (90 Base) MCG/ACT inhaler Commonly known as: VENTOLIN HFA 2 inhalations every 4-6 hours as needed   albuterol (2.5 MG/3ML) 0.083% nebulizer solution Commonly known as: PROVENTIL Take 3 mLs (2.5 mg total) by nebulization every 4 (four) hours as needed for wheezing or shortness of breath.   budesonide-formoterol 160-4.5 MCG/ACT inhaler Commonly known as: SYMBICORT Inhale two puffs one to two times daily as directed to prevent cough or wheeze. Rinse, gargle, and spit after use.   cyanocobalamin 1000 MCG/ML injection Commonly known as: VITAMIN B12 SMARTSIG:1 Milliliter(s) IM   esomeprazole 40 MG  capsule Commonly known as: NEXIUM Take 1 capsule by mouth twice daily   famotidine 40 MG tablet Commonly known as: PEPCID Can take one tablet by mouth daily in the evening if needed.   lisdexamfetamine 50 MG capsule Commonly known as: VYVANSE Take 50 mg by mouth daily.   metFORMIN 500 MG 24 hr tablet Commonly known as: GLUCOPHAGE-XR Take 1,000 mg by mouth at bedtime.   montelukast 10 MG tablet Commonly known as: SINGULAIR Take one tablet by mouth once daily as directed.   Paxlovid 20 x 150 MG & 10 x 100MG  Tbpk Generic drug: nirmatrelvir & ritonavir Take 3 tablets by mouth in the morning and at bedtime. ( Two 150mg  Nirmatrelvir tablets and one 100mg  Ritonavir tablet- twice daily)   spironolactone 50 MG tablet Commonly known as: ALDACTONE Take by mouth.    Past Medical History:  Diagnosis Date   Asthma    GERD (gastroesophageal reflux disease)    Pericarditis     Past Surgical History:  Procedure Laterality Date   ADENOIDECTOMY     TONSILLECTOMY     TYMPANOSTOMY TUBE PLACEMENT      Review of systems negative except as noted in HPI / PMHx or noted below:  Review of Systems  Constitutional: Negative.   HENT: Negative.    Eyes: Negative.   Respiratory: Negative.    Cardiovascular: Negative.   Gastrointestinal: Negative.   Genitourinary: Negative.   Musculoskeletal: Negative.   Skin: Negative.   Neurological: Negative.   Endo/Heme/Allergies: Negative.   Psychiatric/Behavioral: Negative.  Objective:   Vitals:   04/10/23 0857  BP: 110/68  Pulse: 84  Resp: 16  SpO2: 97%           Physical Exam Constitutional:      Appearance: She is not diaphoretic.  HENT:     Head: Normocephalic.     Right Ear: Tympanic membrane, ear canal and external ear normal.     Left Ear: Tympanic membrane, ear canal and external ear normal.     Nose: Nose normal. No mucosal edema or rhinorrhea.     Mouth/Throat:     Pharynx: Uvula midline. No oropharyngeal exudate.   Eyes:     Conjunctiva/sclera: Conjunctivae normal.  Neck:     Thyroid: No thyromegaly.     Trachea: Trachea normal. No tracheal tenderness or tracheal deviation.  Cardiovascular:     Rate and Rhythm: Normal rate and regular rhythm.     Heart sounds: Normal heart sounds, S1 normal and S2 normal. No murmur heard. Pulmonary:     Effort: No respiratory distress.     Breath sounds: Normal breath sounds. No stridor. No wheezing or rales.  Lymphadenopathy:     Head:     Right side of head: No tonsillar adenopathy.     Left side of head: No tonsillar adenopathy.     Cervical: No cervical adenopathy.  Skin:    Findings: No erythema or rash.     Nails: There is no clubbing.  Neurological:     Mental Status: She is alert.     Diagnostics:    Spirometry was performed and demonstrated an FEV1 of 3.59 at 106 % of predicted.  Results of blood tests obtained 26 July 2022 identifies AST 19 U/L, ALT 34 U/L, alpha-1 antitrypsin 98 MGs/DL with MZ phenotype, ENA RNP 1.9 AI  Assessment and Plan:   1. Asthma, moderate persistent, well-controlled   2. Perennial allergic rhinitis   3. LPRD (laryngopharyngeal reflux disease)   4. Heterozygous alpha 1-antitrypsin deficiency   5. Positive sm/RNP antibody    1.  Continue to treat and prevent inflammation:  A. Symbicort 160 - 2 inhalations 1-2 times per day   B. Montelukast 10 mg - 1 tablet 1 time per day C. OTC Nasacort - 1-2 sprays each nostril 3-7 times per week.  2.  Continue to Treat LPR:   A. Nexium to 40 mg 1-2 times per day  B. Famotidine 40 mg in evening if needed  4.  If needed:   A. Albuterol HFA  B. OTC antihistamine  5. Return to clinic in 1 year or earlier if problem  Thienkim appears to be doing quite well regarding her respiratory tract issue on her current plan and her reflux is under very good control as well.  Her alpha-1 antitrypsin serum level is in an adequate range and at this point she will not require any  replacement therapy.  She does have a history of elevated liver function test in the setting of MZ alpha 1 antitrypsin genotype and a positive RNP antibody and her last evaluation for this issue was 2020 and I have encouraged her to touch base with her gastroenterologist about this issue at least 1 additional time within the next year to make sure that this issue is not a progressive issue.  Laurette Schimke, MD Allergy / Immunology [facility]

## 2023-04-10 NOTE — Patient Instructions (Signed)
  1.  Continue to treat and prevent inflammation:  A. Symbicort 160 - 2 inhalations 1-2 times per day   B. Montelukast 10 mg - 1 tablet 1 time per day C. OTC Nasacort - 1-2 sprays each nostril 3-7 times per week.  2.  Continue to Treat LPR:   A. Nexium to 40 mg 1-2 times per day  B. Famotidine 40 mg in evening if needed  4.  If needed:   A. Albuterol HFA  B. OTC antihistamine  5. Return to clinic in 1 year or earlier if problem

## 2023-04-11 ENCOUNTER — Encounter: Payer: Self-pay | Admitting: Allergy and Immunology

## 2023-04-12 ENCOUNTER — Encounter: Payer: Self-pay | Admitting: *Deleted

## 2023-07-03 ENCOUNTER — Ambulatory Visit: Payer: BC Managed Care – PPO | Admitting: Allergy and Immunology

## 2023-08-07 ENCOUNTER — Other Ambulatory Visit: Payer: Self-pay | Admitting: Allergy and Immunology

## 2024-09-23 ENCOUNTER — Encounter: Payer: Self-pay | Admitting: Gastroenterology

## 2024-11-23 ENCOUNTER — Ambulatory Visit: Admitting: Gastroenterology
